# Patient Record
Sex: Male | Born: 1980 | Race: White | Hispanic: No | Marital: Married | Smoking: Never smoker
Health system: Southern US, Community
[De-identification: ages and names within clinical notes are randomized; demographics above are authoritative.]

## PROBLEM LIST (undated history)

## (undated) DIAGNOSIS — I10 Essential (primary) hypertension: Secondary | ICD-10-CM

## (undated) DIAGNOSIS — J45909 Unspecified asthma, uncomplicated: Secondary | ICD-10-CM

## (undated) DIAGNOSIS — M109 Gout, unspecified: Secondary | ICD-10-CM

## (undated) DIAGNOSIS — Z87442 Personal history of urinary calculi: Secondary | ICD-10-CM

## (undated) DIAGNOSIS — E785 Hyperlipidemia, unspecified: Secondary | ICD-10-CM

## (undated) DIAGNOSIS — R030 Elevated blood-pressure reading, without diagnosis of hypertension: Secondary | ICD-10-CM

## (undated) HISTORY — PX: WISDOM TOOTH EXTRACTION: SHX21

## (undated) HISTORY — PX: HERNIA REPAIR: SHX51

## (undated) HISTORY — DX: Morbid (severe) obesity due to excess calories: E66.01

## (undated) HISTORY — DX: Elevated blood-pressure reading, without diagnosis of hypertension: R03.0

## (undated) HISTORY — DX: Essential (primary) hypertension: I10

## (undated) HISTORY — DX: Hyperlipidemia, unspecified: E78.5

---

## 2009-02-27 ENCOUNTER — Emergency Department (HOSPITAL_COMMUNITY): Admission: EM | Admit: 2009-02-27 | Discharge: 2009-02-28 | Payer: Self-pay | Admitting: Emergency Medicine

## 2009-06-13 ENCOUNTER — Emergency Department (HOSPITAL_COMMUNITY): Admission: EM | Admit: 2009-06-13 | Discharge: 2009-06-13 | Payer: Self-pay | Admitting: Emergency Medicine

## 2010-04-20 LAB — RAPID STREP SCREEN (MED CTR MEBANE ONLY): Streptococcus, Group A Screen (Direct): NEGATIVE

## 2010-04-25 ENCOUNTER — Emergency Department (HOSPITAL_COMMUNITY)
Admission: EM | Admit: 2010-04-25 | Discharge: 2010-04-25 | Disposition: A | Discharge status: Home / Self Care | Payer: Commercial Indemnity | Attending: Emergency Medicine | Admitting: Emergency Medicine

## 2010-04-25 DIAGNOSIS — Y92009 Unspecified place in unspecified non-institutional (private) residence as the place of occurrence of the external cause: Secondary | ICD-10-CM | POA: Insufficient documentation

## 2010-04-25 DIAGNOSIS — T1500XA Foreign body in cornea, unspecified eye, initial encounter: Secondary | ICD-10-CM | POA: Insufficient documentation

## 2010-04-25 DIAGNOSIS — IMO0002 Reserved for concepts with insufficient information to code with codable children: Secondary | ICD-10-CM | POA: Insufficient documentation

## 2012-06-20 ENCOUNTER — Encounter: Payer: Self-pay | Admitting: Gastroenterology

## 2012-06-20 ENCOUNTER — Ambulatory Visit (INDEPENDENT_AMBULATORY_CARE_PROVIDER_SITE_OTHER): Payer: Commercial Indemnity | Admitting: Gastroenterology

## 2012-06-20 VITALS — BP 139/78 | HR 78 | Temp 98.1°F | Ht 72.0 in | Wt 306.8 lb

## 2012-06-20 DIAGNOSIS — K760 Fatty (change of) liver, not elsewhere classified: Secondary | ICD-10-CM

## 2012-06-20 DIAGNOSIS — K7689 Other specified diseases of liver: Secondary | ICD-10-CM

## 2012-06-20 NOTE — Patient Instructions (Addendum)
Continue to eat healthy as you are doing.   We will see you back in 3 months. At that time, we will recheck your liver numbers.  Please call if you have any further pain in the meantime.

## 2012-06-21 ENCOUNTER — Encounter: Payer: Self-pay | Admitting: Gastroenterology

## 2012-06-21 DIAGNOSIS — K760 Fatty (change of) liver, not elsewhere classified: Secondary | ICD-10-CM | POA: Insufficient documentation

## 2012-06-21 NOTE — Assessment & Plan Note (Signed)
32 year old male with mild elevated transaminases in the setting of a fatty liver, now reporting normalization of LFTs recently after significant change in diet, abstinence of ETOH, and weight loss of 28 lbs intentionally. [place] of abdomen with fatty liver, hepatic cyst, gallbladder polyps, gallstones. He reports vague mid-abdominal/RUQ discomfort about once a month, with the most severe episode after eggs. No concerning signs noted otherwise. Likely elevated AST/ALT secondary to fatty liver No need for further imaging of abdomen as cysts is likely benign. Need to obtain most recent blood work, return in 3 months. Consider HIDA vs EGD if further abdominal discomfort; however, I feel this may be dietary related. Continue weight loss. Recheck LFTs in 3 months.

## 2012-06-21 NOTE — Assessment & Plan Note (Signed)
Continue low-fat diet, avoidance of ETOH. LFTs in 3 months.

## 2012-06-21 NOTE — Progress Notes (Signed)
Referring Provider: Cheron Every, FNP Primary Care Physician:  Erasmo Downer, NP Primary Gastroenterologist:  Dr. Darrick Penna   Chief Complaint  Patient presents with  . abnormal [place]    HPI:   Mr. Jesse Terry is a pleasant 32 year old male, presenting at the request of Cheron Every, FNP, secondary to elevated LFTs ad abnormal ultrasound. Appears AST 51 and ALT 84 in April 2014. [place] of abdomen showed mild fatty liver, hepatic cyst, +gallstones without cholecystitis, gallbladder polyps, bilateral kidney stones without obstruction, mild splenomegaly.  He tells me his most recent LFTs were normal. After first LFTs drawn in April he changed eating habits, lost about 28 lbs purposefully. Rare discomfort at umbilicus noted X 6 months, usually once per month, radiation to RUQ. No vomiting or GERD.Denies constipation, diarrhea, rectal bleeding. Abdominal discomfort was most noticeable after eating eggs.    Past Medical History  Diagnosis Date  . Borderline hypertension   . Hyperlipidemia     Past Surgical History  Procedure Laterality Date  . Hernia repair    . Wisdom tooth extraction      No current outpatient prescriptions on file.   No current facility-administered medications for this visit.    Allergies as of 06/20/2012  . (No Known Allergies)    Family History  Problem Relation Age of Onset  . Colon cancer Neg Hx     History   Social History  . Marital Status: Married    Spouse Name: N/A    Number of Children: N/A  . Years of Education: N/A   Occupational History  . Not on file.   Social History Main Topics  . Smoking status: Never Smoker   . Smokeless tobacco: Not on file  . Alcohol Use: Yes     Comment: former use last drink was one month ago: 1-2 drinks on the weekend  . Drug Use: No  . Sexually Active: Not on file   Other Topics Concern  . Not on file   Social History Narrative  . No narrative on file    Review of Systems: Negative unless mentioned in  HPI  Physical Exam: BP 139/78  Pulse 78  Temp(Src) 98.1 F (36.7 C) (Oral)  Ht 6' (1.829 m)  Wt 306 lb 12.8 oz (139.164 kg)  BMI 41.6 kg/m2 General:   Alert and oriented. Well-developed, well-nourished, pleasant and cooperative. Head:  Normocephalic and atraumatic. Eyes:  Conjunctiva pink, sclera clear, no icterus.   Conjunctiva pink. Ears:  Normal auditory acuity. Nose:  No deformity, discharge,  or lesions. Mouth:  No deformity or lesions, mucosa pink and moist.  Neck:  Supple, without mass or thyromegaly. Lungs:  Clear to auscultation bilaterally, without wheezing, rales, or rhonchi.  Heart:  S1, S2 present without murmurs noted.  Abdomen:  +BS, soft, obese, non-tender and non-distended. Without mass or HSM. No rebound or guarding. No hernias noted. Rectal:  Deferred  Msk:  Symmetrical without gross deformities. Normal posture. Extremities:  Without clubbing or edema. Neurologic:  Alert and  oriented x4;  grossly normal neurologically. Skin:  Intact, warm and dry without significant lesions or rashes Cervical Nodes:  No significant cervical adenopathy. Psych:  Alert and cooperative. Normal mood and affect.

## 2012-06-22 NOTE — Progress Notes (Signed)
Cc PCP 

## 2012-06-27 ENCOUNTER — Other Ambulatory Visit: Payer: Self-pay | Admitting: Gastroenterology

## 2012-06-27 DIAGNOSIS — K760 Fatty (change of) liver, not elsewhere classified: Secondary | ICD-10-CM

## 2012-07-11 NOTE — Progress Notes (Signed)
Repeat LFTs in May 2014 completely normalized. Will abstract.

## 2012-07-17 LAB — HEPATIC FUNCTION PANEL
AST: 21 U/L
Albumin: 4.1
Alkaline Phosphatase: 74 U/L
Bilirubin, Indirect: 0.5
Total Bilirubin: 0.7 mg/dL

## 2012-07-17 LAB — COMPREHENSIVE METABOLIC PANEL
Alkaline Phosphatase: 78 U/L
Total Bilirubin: 0.9 mg/dL

## 2012-07-18 ENCOUNTER — Telehealth: Payer: Self-pay | Admitting: Gastroenterology

## 2012-07-18 NOTE — Progress Notes (Signed)
In reviewing patient's information, will need to proceed with surveillance [place] of abdomen due to gallbladder polyps at next visit.  Will discuss surgical referral for elective cholecystectomy at that time.

## 2012-07-18 NOTE — Telephone Encounter (Signed)
I called pt and he said that he spoke to Jesse Terry about doing a Nurse, learning disability when he had the office visit. He said she was interested in it and asked him to let her know how it went if he did it. He was calling today to see if she could give him a note for a couple of days off work so he could do the cleansing. He is not having any problems today. However he did have some right abdominal pain on mon that lasted several hours. He did not have any vomiting and maybe slight nausea. He said he is not having any pain or problems today, he just thought he would like to do it. He's afraid that he will have more episodes of the abdominal pain. I explained to him that Jesse Terry is away until Tues, and I did not think that she would take him out of work to do something that she did not recommend. I told him I will let PA know and see if she has any recommendations and we could certainly let Jesse Terry know when she returns on Tues. I told him if he starts having any abdominal pain, nausea vomiting to please let us know.

## 2012-07-18 NOTE — Telephone Encounter (Signed)
Pt is aware that this will be addressed with Tobi Bastos when she returns.

## 2012-07-18 NOTE — Telephone Encounter (Signed)
Patient is asking for Jesse Terry to take him out of work for two days to do the Gallbladder Cleansing that was discussed at the office visit. 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 I explained to him it will be next week before Jesse Terry can answer because she is out of the office, you can reach him at [phone]

## 2012-07-18 NOTE — Telephone Encounter (Signed)
Defer to [facility] regarding "gallbladder cleansing".

## 2012-07-25 NOTE — Telephone Encounter (Signed)
Called and informed pt. He is doing better at this time and will call if he has problems.

## 2012-07-25 NOTE — Telephone Encounter (Signed)
In reviewing patient's information, will need to proceed with surveillance [place] of abdomen due to gallbladder polyps at next visit.  Will discuss surgical referral for elective cholecystectomy at that time.   We do not recommend gallbladder cleansing; won't be able to take him out of work for this.

## 2012-08-14 ENCOUNTER — Other Ambulatory Visit: Payer: Self-pay

## 2012-08-14 DIAGNOSIS — K760 Fatty (change of) liver, not elsewhere classified: Secondary | ICD-10-CM

## 2012-09-05 ENCOUNTER — Ambulatory Visit: Payer: Commercial Indemnity | Admitting: Gastroenterology

## 2012-09-12 ENCOUNTER — Ambulatory Visit: Payer: Commercial Indemnity | Admitting: Gastroenterology

## 2012-09-19 ENCOUNTER — Ambulatory Visit: Payer: Commercial Indemnity | Admitting: Gastroenterology

## 2012-09-19 ENCOUNTER — Telehealth: Payer: Self-pay | Admitting: *Deleted

## 2012-09-19 NOTE — Telephone Encounter (Signed)
Please send letter for f/u.  

## 2012-09-19 NOTE — Telephone Encounter (Signed)
Pt is a no show

## 2012-09-20 ENCOUNTER — Encounter: Payer: Self-pay | Admitting: *Deleted

## 2012-09-20 NOTE — Telephone Encounter (Signed)
LETTER SENT TO PT.

## 2012-11-05 NOTE — Progress Notes (Signed)
Please route this note to PCP again, as I added an update around June (I didn't send it to you then, my fault). They had sent a referral to [place] due to gallbladder polyps. We have recommend surveillance [place] due to gallbladder polyps, with consideration for lap chole in the future. Patient no-showed his last visit.   Please CC PCP. Thanks!

## 2012-11-05 NOTE — Telephone Encounter (Signed)
I just want to make sure that patient knows that he will need routine ultrasounds of his abdomen due to the gallbladder polyps. I did not review this with him at the time of the visit, and it is important that he knows that this needs to be followed up on. The reason for this is because of the risk for gallbladder cancer due to the polyps; I recommend elective referral to general surgery. He no-showed his last visit; we need to get him back in to recheck LFTs and discuss the plan for gallbladder surveillance vs elective removal.

## 2012-11-05 NOTE — Progress Notes (Signed)
CC'd to PCP 

## 2012-11-07 NOTE — Telephone Encounter (Signed)
Tried to call pt. He was not at home, but a child gave me a phone number to try to reach him at, [phone]. I tried to call and got VM. LMOM for a return call for some important information. Mailing  Letter to call also.

## 2012-11-21 ENCOUNTER — Encounter (HOSPITAL_BASED_OUTPATIENT_CLINIC_OR_DEPARTMENT_OTHER): Payer: Self-pay | Admitting: *Deleted

## 2012-11-26 ENCOUNTER — Other Ambulatory Visit: Payer: Self-pay | Admitting: Orthopedic Surgery

## 2012-11-28 NOTE — H&P (Signed)
  [facility] [address]   [address] [place], [place] [address] [phone] A Division of [facility]  Loreta Ave, M.D.   Robert A. Thurston Hole, M.D.   Burnell Blanks, M.D.   Eulas Post, M.D.   Lunette Stands, M.D Jewel Baize. Eulah Pont, M.D.  Buford Dresser, M.D.  Charlsie Quest, M.D.  Estell Harpin, M.D.   Melina Fiddler, M.D. Danford Bad. Willa Rough, PA-C  Kirstin A. Shepperson, PA-C  Josh Tunica, PA-C [place], [place]   RE: Jesse Terry, Jesse Terry                                [id number]      DOB: Feb 16, 1980 PROGRESS NOTE: 11-16-12 Jesse Terry is seen in consultation today at the request of Dr. Farris Has.  Longstanding worsening symptoms; pain, catching and losing motion in the right ankle.  All of this tends to be medial and anteromedial.  Getting worse rather than better.  Having more and more episodes of this acute sharp pain with less and less provocation.  I have reviewed workup and treatment to date.  X-rays showed some mild degenerative changes medially, but not too severe.  Persistent symptoms eventually leading to workup with MRI.  This shows a picture of medial bony impingement in the ankle.  Bony impingement between the talus and medial malleolus with kissing bony contusions.  Other structures look fairly good.  Articular cartilage looks good.  Ligaments intact.  Presents to discuss definitive treatment.  Based on workup and studies really the only true option we have is a decompression to obliterate the bony impingement and symptoms from that.  I met with [name] and his wife.  I went through his workup.  I have looked at his x-rays, his MRI and his MRI report.    EXAMINATION: General exam is outlined and included in the chart.  Height: 6?0.  Weight: 310 pounds.  Right ankle has limited dorsiflexion of about 5 degrees with acute pain medially and bone on bone impingement.  Fairly good plantarflexion.  No neurovascular  compromise.  A little swelling across the ankle.  No instability.  Neurovascularly intact distally.  The opposite left ankle has very good motion without any impingement or instability.    DISPOSITION:  Anterior impingement, right ankle, posttraumatic.  Getting worse rather than better.  The only viable option we have here is arthroscopic decompression.  He and his wife both feel his symptoms are bad enough to warrant this.  We discussed exam under anesthesia, arthroscopy, debridement and decompression.  Procedure, risks, benefits and complications reviewed in detail.  He works as a Naval architect for [employer].  I have told him that he would probably be out of work for six weeks.  Obviously the more degenerative change we find in his ankle the more his prognosis is worse in the future.  More than 25 minutes spent face-to-face covering all of this with him.     Loreta Ave, M.D.   Electronically verified by Loreta Ave, M.D. DFM:jjh D 11-16-12

## 2012-11-29 ENCOUNTER — Ambulatory Visit (HOSPITAL_BASED_OUTPATIENT_CLINIC_OR_DEPARTMENT_OTHER)
Admission: RE | Admit: 2012-11-29 | Discharge: 2012-11-29 | Disposition: A | Discharge status: Home / Self Care | Payer: Managed Care, Other (non HMO) | Source: Ambulatory Visit | Attending: Orthopedic Surgery | Admitting: Orthopedic Surgery

## 2012-11-29 ENCOUNTER — Ambulatory Visit (HOSPITAL_BASED_OUTPATIENT_CLINIC_OR_DEPARTMENT_OTHER): Payer: Managed Care, Other (non HMO) | Admitting: Anesthesiology

## 2012-11-29 ENCOUNTER — Encounter (HOSPITAL_BASED_OUTPATIENT_CLINIC_OR_DEPARTMENT_OTHER)
Admission: RE | Disposition: A | Discharge status: Home / Self Care | Payer: Self-pay | Source: Ambulatory Visit | Attending: Orthopedic Surgery

## 2012-11-29 ENCOUNTER — Encounter (HOSPITAL_BASED_OUTPATIENT_CLINIC_OR_DEPARTMENT_OTHER): Payer: Managed Care, Other (non HMO) | Admitting: Anesthesiology

## 2012-11-29 ENCOUNTER — Encounter (HOSPITAL_BASED_OUTPATIENT_CLINIC_OR_DEPARTMENT_OTHER): Payer: Self-pay | Admitting: Anesthesiology

## 2012-11-29 DIAGNOSIS — I10 Essential (primary) hypertension: Secondary | ICD-10-CM | POA: Insufficient documentation

## 2012-11-29 DIAGNOSIS — M25571 Pain in right ankle and joints of right foot: Secondary | ICD-10-CM

## 2012-11-29 DIAGNOSIS — M12579 Traumatic arthropathy, unspecified ankle and foot: Secondary | ICD-10-CM | POA: Insufficient documentation

## 2012-11-29 DIAGNOSIS — M6580 Other synovitis and tenosynovitis, unspecified site: Secondary | ICD-10-CM | POA: Insufficient documentation

## 2012-11-29 DIAGNOSIS — M898X9 Other specified disorders of bone, unspecified site: Secondary | ICD-10-CM | POA: Insufficient documentation

## 2012-11-29 HISTORY — DX: Unspecified asthma, uncomplicated: J45.909

## 2012-11-29 HISTORY — PX: ANKLE ARTHROSCOPY: SHX545

## 2012-11-29 SURGERY — ARTHROSCOPY, ANKLE
Anesthesia: Regional | Site: Ankle | Laterality: Right | Wound class: Clean

## 2012-11-29 MED ORDER — BUPIVACAINE HCL (PF) 0.5 % IJ SOLN
INTRAMUSCULAR | Status: DC | PRN
  Administered 2012-11-29: 10 mL

## 2012-11-29 MED ORDER — MIDAZOLAM HCL 2 MG/2ML IJ SOLN
1.0000 mg | INTRAMUSCULAR | Status: DC | PRN
  Administered 2012-11-29: 2 mg via INTRAVENOUS

## 2012-11-29 MED ORDER — LIDOCAINE HCL (CARDIAC) 20 MG/ML IV SOLN
INTRAVENOUS | Status: DC | PRN
  Administered 2012-11-29: 50 mg via INTRAVENOUS

## 2012-11-29 MED ORDER — OXYCODONE HCL 5 MG PO TABS: 5.0000 mg | ORAL_TABLET | Freq: Once | ORAL | Status: DC | PRN

## 2012-11-29 MED ORDER — ONDANSETRON HCL 4 MG/2ML IJ SOLN
INTRAMUSCULAR | Status: DC | PRN
  Administered 2012-11-29: 4 mg via INTRAVENOUS

## 2012-11-29 MED ORDER — BUPIVACAINE HCL (PF) 0.25 % IJ SOLN
INTRAMUSCULAR | Status: DC | PRN
  Administered 2012-11-29: 5 mL

## 2012-11-29 MED ORDER — CEFAZOLIN SODIUM 1-5 GM-% IV SOLN
INTRAVENOUS | Status: AC
  Filled 2012-11-29: qty 50

## 2012-11-29 MED ORDER — BUPIVACAINE HCL (PF) 0.25 % IJ SOLN
INTRAMUSCULAR | Status: AC
  Filled 2012-11-29: qty 30

## 2012-11-29 MED ORDER — PROPOFOL 10 MG/ML IV BOLUS
INTRAVENOUS | Status: DC | PRN
  Administered 2012-11-29: 250 mg via INTRAVENOUS

## 2012-11-29 MED ORDER — CEFAZOLIN SODIUM-DEXTROSE 2-3 GM-% IV SOLR
INTRAVENOUS | Status: AC
  Filled 2012-11-29: qty 50

## 2012-11-29 MED ORDER — METHYLPREDNISOLONE ACETATE 80 MG/ML IJ SUSP
INTRAMUSCULAR | Status: AC
  Filled 2012-11-29: qty 1

## 2012-11-29 MED ORDER — OXYCODONE HCL 5 MG/5ML PO SOLN: 5.0000 mg | Freq: Once | ORAL | Status: DC | PRN

## 2012-11-29 MED ORDER — HYDROMORPHONE HCL PF 1 MG/ML IJ SOLN
INTRAMUSCULAR | Status: AC
  Filled 2012-11-29: qty 1

## 2012-11-29 MED ORDER — FENTANYL CITRATE 0.05 MG/ML IJ SOLN
INTRAMUSCULAR | Status: AC
  Filled 2012-11-29: qty 2

## 2012-11-29 MED ORDER — DEXTROSE 5 % IV SOLN
3.0000 g | INTRAVENOUS | Status: AC
  Administered 2012-11-29: 3 g via INTRAVENOUS

## 2012-11-29 MED ORDER — MIDAZOLAM HCL 2 MG/2ML IJ SOLN
INTRAMUSCULAR | Status: AC
  Filled 2012-11-29: qty 2

## 2012-11-29 MED ORDER — CHLORHEXIDINE GLUCONATE 4 % EX LIQD: 60.0000 mL | Freq: Once | CUTANEOUS | Status: DC

## 2012-11-29 MED ORDER — BUPIVACAINE-EPINEPHRINE PF 0.5-1:200000 % IJ SOLN
INTRAMUSCULAR | Status: DC | PRN
  Administered 2012-11-29: 30 mL

## 2012-11-29 MED ORDER — FENTANYL CITRATE 0.05 MG/ML IJ SOLN
50.0000 ug | INTRAMUSCULAR | Status: DC | PRN
  Administered 2012-11-29: 100 ug via INTRAVENOUS

## 2012-11-29 MED ORDER — DEXAMETHASONE SODIUM PHOSPHATE 4 MG/ML IJ SOLN
INTRAMUSCULAR | Status: DC | PRN
  Administered 2012-11-29: 10 mg via INTRAVENOUS

## 2012-11-29 MED ORDER — HYDROMORPHONE HCL PF 1 MG/ML IJ SOLN
0.2500 mg | INTRAMUSCULAR | Status: DC | PRN
  Administered 2012-11-29 (×3): 0.5 mg via INTRAVENOUS

## 2012-11-29 MED ORDER — FENTANYL CITRATE 0.05 MG/ML IJ SOLN
INTRAMUSCULAR | Status: DC | PRN
  Administered 2012-11-29 (×2): 25 ug via INTRAVENOUS
  Administered 2012-11-29: 50 ug via INTRAVENOUS

## 2012-11-29 MED ORDER — ACETAMINOPHEN 500 MG PO TABS
1000.0000 mg | ORAL_TABLET | Freq: Once | ORAL | Status: AC
  Administered 2012-11-29: 1000 mg via ORAL

## 2012-11-29 MED ORDER — LACTATED RINGERS IV SOLN
INTRAVENOUS | Status: DC
  Administered 2012-11-29 (×3): via INTRAVENOUS

## 2012-11-29 MED ORDER — METHYLPREDNISOLONE ACETATE 80 MG/ML IJ SUSP
INTRAMUSCULAR | Status: DC | PRN
  Administered 2012-11-29: 80 mg via INTRA_ARTICULAR

## 2012-11-29 MED ORDER — FENTANYL CITRATE 0.05 MG/ML IJ SOLN
INTRAMUSCULAR | Status: AC
  Filled 2012-11-29: qty 4

## 2012-11-29 MED ORDER — OXYCODONE-ACETAMINOPHEN 7.5-325 MG PO TABS: 1.0000 | ORAL_TABLET | ORAL | Status: DC | PRN

## 2012-11-29 MED ORDER — DEXTROSE-NACL 5-0.45 % IV SOLN: INTRAVENOUS | Status: DC

## 2012-11-29 SURGICAL SUPPLY — 47 items
BANDAGE ELASTIC 4 VELCRO ST LF (GAUZE/BANDAGES/DRESSINGS) ×2 IMPLANT
BLADE 4.2CUDA (BLADE) IMPLANT
BLADE CUDA GRT WHITE 3.5 (BLADE) IMPLANT
BLADE CUDA SHAVER 3.5 (BLADE) IMPLANT
BLADE CUTTER GATOR 3.5 (BLADE) IMPLANT
BLADE GREAT WHITE 4.2 (BLADE) IMPLANT
BNDG ESMARK 4X9 LF (GAUZE/BANDAGES/DRESSINGS) ×2 IMPLANT
BUR CUDA 2.9 (BURR) IMPLANT
BUR FULL RADIUS 2.9 (BURR) IMPLANT
BUR OVAL 4.0 (BURR) IMPLANT
BUR OVAL 6.0 (BURR) IMPLANT
CANISTER OMNI JUG 16 LITER (MISCELLANEOUS) IMPLANT
CANISTER SUCT 3000ML (MISCELLANEOUS) IMPLANT
CUFF TOURNIQUET SINGLE 34IN LL (TOURNIQUET CUFF) IMPLANT
CUTTER MENISCUS  4.2MM (BLADE)
CUTTER MENISCUS 4.2MM (BLADE) IMPLANT
DECANTER SPIKE VIAL GLASS SM (MISCELLANEOUS) IMPLANT
DRAPE ARTHROSCOPY W/POUCH 90 (DRAPES) ×2 IMPLANT
DRAPE OEC MINIVIEW 54X84 (DRAPES) IMPLANT
DRAPE SURG 17X23 STRL (DRAPES) ×2 IMPLANT
DURAPREP 26ML APPLICATOR (WOUND CARE) ×2 IMPLANT
ELECT MENISCUS 165MM 90D (ELECTRODE) IMPLANT
ELECT REM PT RETURN 9FT ADLT (ELECTROSURGICAL) ×2
ELECTRODE REM PT RTRN 9FT ADLT (ELECTROSURGICAL) ×1 IMPLANT
GAUZE XEROFORM 1X8 LF (GAUZE/BANDAGES/DRESSINGS) ×2 IMPLANT
GLOVE BIO SURGEON STRL SZ8 (GLOVE) ×2 IMPLANT
GLOVE BIOGEL PI IND STRL 8.5 (GLOVE) ×1 IMPLANT
GLOVE BIOGEL PI INDICATOR 8.5 (GLOVE) ×1
GLOVE ORTHO TXT STRL SZ7.5 (GLOVE) ×4 IMPLANT
GOWN BRE IMP PREV XXLGXLNG (GOWN DISPOSABLE) ×2 IMPLANT
GOWN PREVENTION PLUS XLARGE (GOWN DISPOSABLE) ×2 IMPLANT
IV NS IRRIG 3000ML ARTHROMATIC (IV SOLUTION) IMPLANT
NEEDLE KEITH (NEEDLE) IMPLANT
PACK ARTHROSCOPY DSU (CUSTOM PROCEDURE TRAY) ×2 IMPLANT
PACK BASIN DAY SURGERY FS (CUSTOM PROCEDURE TRAY) ×2 IMPLANT
PENCIL BUTTON HOLSTER BLD 10FT (ELECTRODE) IMPLANT
SET ARTHROSCOPY TUBING (MISCELLANEOUS) ×1
SET ARTHROSCOPY TUBING LN (MISCELLANEOUS) ×1 IMPLANT
SLEEVE SCD COMPRESS KNEE MED (MISCELLANEOUS) IMPLANT
SPONGE GAUZE 4X4 12PLY (GAUZE/BANDAGES/DRESSINGS) ×4 IMPLANT
STOCKINETTE 4X48 STRL (DRAPES) ×2 IMPLANT
STRAP ANKLE FOOT DISTRACTOR (ORTHOPEDIC SUPPLIES) IMPLANT
SUT ETHILON 3 0 PS 1 (SUTURE) ×2 IMPLANT
SUT VIC AB 3-0 SH 27 (SUTURE)
SUT VIC AB 3-0 SH 27X BRD (SUTURE) IMPLANT
TUBE CONNECTING 20X1/4 (TUBING) IMPLANT
WATER STERILE IRR 1000ML POUR (IV SOLUTION) ×2 IMPLANT

## 2012-11-29 NOTE — Transfer of Care (Signed)
Immediate Anesthesia Transfer of Care Note  Patient: Jesse Terry  Procedure(s) Performed: Procedure(s): RIGHT ANKLE ARTHROSCOPY WITH EXTENSIVE DEBRIDEMENT (Right)  Patient Location: PACU  Anesthesia Type:General  Level of Consciousness: awake and alert   Airway & Oxygen Therapy: Patient Spontanous Breathing and Patient connected to face mask oxygen  Post-op Assessment: Report given to PACU RN and Post -op Vital signs reviewed and stable  Post vital signs: Reviewed and stable  Complications: No apparent anesthesia complications

## 2012-11-29 NOTE — Interval H&P Note (Signed)
History and Physical Interval Note:  11/29/2012 7:35 AM  Jesse Terry  has presented today for surgery, with the diagnosis of RIGHT ANKLE: ARTICULAR CARTILAGE DISORDER, ANKLE AND FOOT  The various methods of treatment have been discussed with the patient and family. After consideration of risks, benefits and other options for treatment, the patient has consented to  Procedure(s): RIGHT ANKLE ARTHROSCOPY WITH EXTENSIVE DEBRIDEMENT (Right) as a surgical intervention .  The patient's history has been reviewed, patient examined, no change in status, stable for surgery.  I have reviewed the patient's chart and labs.  Questions were answered to the patient's satisfaction.     MURPHY,DANIEL F

## 2012-11-29 NOTE — Anesthesia Postprocedure Evaluation (Signed)
  Anesthesia Post-op Note  Patient: Jesse Terry  Procedure(s) Performed: Procedure(s): RIGHT ANKLE ARTHROSCOPY WITH EXTENSIVE DEBRIDEMENT (Right)  Patient Location: PACU  Anesthesia Type:General and block  Level of Consciousness: awake and alert   Airway and Oxygen Therapy: Patient Spontanous Breathing  Post-op Pain: mild  Post-op Assessment: Post-op Vital signs reviewed, Patient's Cardiovascular Status Stable and Respiratory Function Stable  Post-op Vital Signs: Reviewed  Filed Vitals:   11/29/12 1245  BP: 124/78  Pulse: 92  Temp:   Resp: 17    Complications: No apparent anesthesia complications

## 2012-11-29 NOTE — Anesthesia Preprocedure Evaluation (Signed)
Anesthesia Evaluation  Patient identified by MRN, date of birth, ID band Patient awake    Reviewed: Allergy & Precautions, H&P , NPO status , Patient's Chart, lab work & pertinent test results  Airway Mallampati: I TM Distance: >3 FB Neck ROM: Full    Dental no notable dental hx. (+) Teeth Intact and Dental Advisory Given   Pulmonary neg pulmonary ROS,  breath sounds clear to auscultation  Pulmonary exam normal       Cardiovascular hypertension, Rhythm:Regular Rate:Normal     Neuro/Psych negative neurological ROS  negative psych ROS   GI/Hepatic negative GI ROS, Neg liver ROS,   Endo/Other  Morbid obesity  Renal/GU negative Renal ROS  negative genitourinary   Musculoskeletal   Abdominal   Peds  Hematology negative hematology ROS (+)   Anesthesia Other Findings   Reproductive/Obstetrics negative OB ROS                           Anesthesia Physical Anesthesia Plan  ASA: III  Anesthesia Plan: General and Regional   Post-op Pain Management:    Induction: Intravenous  Airway Management Planned: LMA  Additional Equipment:   Intra-op Plan:   Post-operative Plan: Extubation in OR  Informed Consent: I have reviewed the patients History and Physical, chart, labs and discussed the procedure including the risks, benefits and alternatives for the proposed anesthesia with the patient or authorized representative who has indicated his/her understanding and acceptance.   Dental advisory given  Plan Discussed with: CRNA  Anesthesia Plan Comments:         Anesthesia Quick Evaluation

## 2012-11-29 NOTE — Progress Notes (Signed)
Assisted Dr. Fitzgerald with right, ultrasound guided, popliteal/saphenous block. Side rails up, monitors on throughout procedure. See vital signs in flow sheet. Tolerated Procedure well. 

## 2012-11-29 NOTE — Anesthesia Procedure Notes (Addendum)
Anesthesia Regional Block:  Popliteal block  Pre-Anesthetic Checklist: ,, timeout performed, Correct Patient, Correct Site, Correct Laterality, Correct Procedure, Correct Position, site marked, Risks and benefits discussed, pre-op evaluation, post-op pain management  Laterality: Right  Prep: Maximum Sterile Barrier Precautions used and chloraprep       Needles:  Injection technique: Single-shot  Needle Type: Echogenic Stimulator Needle      Needle Gauge: 21 and 21 G    Additional Needles:  Procedures: ultrasound guided (picture in chart) and nerve stimulator Popliteal block  Nerve Stimulator or Paresthesia:  Response: Peroneal,  Response: Tibial,   Additional Responses:   Narrative:  Start time: 11/29/2012 9:28 AM End time: 11/29/2012 9:42 AM Injection made incrementally with aspirations every 5 mL. Anesthesiologist: Sampson Goon, MD  Additional Notes: 2% Lidocaine skin wheel. Saphenous block with 10cc of 0.5% Bupivicaine plain.  Popliteal block Procedure Name: LMA Insertion Date/Time: 11/29/2012 9:58 AM Performed by: Caren Macadam Pre-anesthesia Checklist: Patient identified, Emergency Drugs available, Suction available and Patient being monitored Patient Re-evaluated:Patient Re-evaluated prior to inductionOxygen Delivery Method: Circle System Utilized Preoxygenation: Pre-oxygenation with 100% oxygen Intubation Type: IV induction Ventilation: Mask ventilation without difficulty LMA: LMA inserted LMA Size: 5.0 Number of attempts: 1 Airway Equipment and Method: bite block Placement Confirmation: positive ETCO2 and breath sounds checked- equal and bilateral Tube secured with: Tape Dental Injury: Teeth and Oropharynx as per pre-operative assessment

## 2012-11-30 ENCOUNTER — Encounter (HOSPITAL_BASED_OUTPATIENT_CLINIC_OR_DEPARTMENT_OTHER): Payer: Self-pay | Admitting: Orthopedic Surgery

## 2012-11-30 NOTE — Op Note (Signed)
NAMEANSEN, SAYEGH                ACCOUNT NO.:  [id number]  MEDICAL RECORD NO.:  [id number]  LOCATION:                               FACILITY:  [facility]  PHYSICIAN:  Loreta Ave, M.D. 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  Reactive synovitis anteriorly. Minimal diffuse degenerative changes, otherwise.  No instability.  PROCEDURE:  Right ankle exam under anesthesia, arthroscopy.  Partial synovectomy.  Extensive debridement with removal of impinging talotibial spurs throughout the entire ankle especially on the medial side. Fluoroscopic confirmation of adequate decompression.  SURGEON:  Loreta Ave, M.D.  ASSISTANT:  Domingo Cocking, PA, present throughout the entire case and necessary for timely completion of the procedure.  ANESTHESIA:  General.  BLOOD LOSS:  Minimal.  SPECIMENS:  None.  CULTURES:  None.  COMPLICATIONS:  None.  DRESSINGS:  Soft compressive.  TOURNIQUET TIME:  45 minutes.  PROCEDURE IN DETAIL:  The patient was brought to the operating room, placed on the operating table in supine position.  After adequate anesthesia been obtained, tourniquet applied, arthroscopy applied, prepped and draped in usual sterile fashion.  Exsanguinated with elevation of an Esmarch.  Tourniquet inflated to 350 mmHg.  Two portals, one each medial and lateral parapatellar.  Arthroscope reduced, ankle distended and inspected.  A lot of inflammatory debris, synovitis throughout the front of the  ankle debrided.  Confirmed marked impingement tibiotalar spurs especially on the medial side greater than lateral.  Sequential removal of all spurs with a high-speed bur obliterating all anterior impingement.  I then had excellent access to the entire ankle.  All other articular surfaces were quite good with minimal degenerative change.  I thoroughly inspected the ankle to make sure there was no further impingement.  Fluoroscopic views also obtained, did show full motion without impingement.  Instruments and fluid were removed.  Ankle injected with Marcaine.  Portals injected with Marcaine, closed with nylon.  Sterile compressive dressing applied. Tourniquet deflated and removed.  Anesthesia reversed.  Brought to the recovery room.  Tolerated the surgery well.  No complications.     Loreta Ave, M.D.     DFM/MEDQ  D:  11/29/2012  T:  11/30/2012  Job:  [id number]

## 2012-12-29 NOTE — Progress Notes (Signed)
REVIEWED.  

## 2013-01-15 ENCOUNTER — Emergency Department (HOSPITAL_COMMUNITY): Payer: Managed Care, Other (non HMO)

## 2013-01-15 ENCOUNTER — Encounter (HOSPITAL_COMMUNITY): Payer: Self-pay | Admitting: Emergency Medicine

## 2013-01-15 DIAGNOSIS — R030 Elevated blood-pressure reading, without diagnosis of hypertension: Secondary | ICD-10-CM | POA: Insufficient documentation

## 2013-01-15 DIAGNOSIS — R002 Palpitations: Secondary | ICD-10-CM | POA: Insufficient documentation

## 2013-01-15 DIAGNOSIS — J45909 Unspecified asthma, uncomplicated: Secondary | ICD-10-CM | POA: Insufficient documentation

## 2013-01-15 DIAGNOSIS — E785 Hyperlipidemia, unspecified: Secondary | ICD-10-CM | POA: Insufficient documentation

## 2013-01-15 DIAGNOSIS — R000 Tachycardia, unspecified: Secondary | ICD-10-CM | POA: Insufficient documentation

## 2013-01-15 LAB — CBC
HCT: 38.3 % — ABNORMAL LOW (ref 39.0–52.0)
Hemoglobin: 13.1 g/dL (ref 13.0–17.0)
MCH: 27.9 pg (ref 26.0–34.0)
MCHC: 34.2 g/dL (ref 30.0–36.0)

## 2013-01-15 LAB — POCT I-STAT TROPONIN I: Troponin i, poc: 0 ng/mL (ref 0.00–0.08)

## 2013-01-15 LAB — BASIC METABOLIC PANEL
BUN: 14 mg/dL (ref 6–23)
Calcium: 9.5 mg/dL (ref 8.4–10.5)
GFR calc non Af Amer: >90 mL/min (ref >90)
Glucose, Bld: 125 mg/dL — ABNORMAL HIGH (ref 70–99)

## 2013-01-15 NOTE — ED Notes (Signed)
Pt states he has been having a "fluttering" feeling in his chest since 0900.  Pt states he has felt similar in the past, but not for this long.

## 2013-01-16 ENCOUNTER — Emergency Department (HOSPITAL_COMMUNITY)
Admission: EM | Admit: 2013-01-16 | Discharge: 2013-01-16 | Disposition: A | Discharge status: Home / Self Care | Payer: Managed Care, Other (non HMO) | Attending: Emergency Medicine | Admitting: Emergency Medicine

## 2013-01-16 DIAGNOSIS — R002 Palpitations: Secondary | ICD-10-CM

## 2013-01-16 NOTE — ED Notes (Signed)
Pt A&Ox4, ambulatory upon discharge, verbalizing no complaints at this time.

## 2013-01-16 NOTE — ED Provider Notes (Signed)
CSN: [id number]     Arrival date & time 01/15/13  2237 History   First MD Initiated Contact with Patient 01/16/13 0210     Chief Complaint  Patient presents with  . Palpitations   (Consider location/radiation/quality/duration/timing/severity/associated sxs/prior Treatment) HPI Hx per PT - fluttering sensation in his chest since this am. Drinks tea during the day, no sodas, no coffee. No CP or SOB. No F/C or recent illness. Symptoms mild to MOD.  Lasted most the day, now resolved. H/o same but never lasted that long. No medications. No h/o thyroid disease.   Past Medical History  Diagnosis Date  . Borderline hypertension   . Hyperlipidemia   . Asthma     as child, no problems now   Past Surgical History  Procedure Laterality Date  . Hernia repair    . Wisdom tooth extraction    . Wisdom tooth extraction    . Ankle arthroscopy Right 11/29/2012    Procedure: RIGHT ANKLE ARTHROSCOPY WITH EXTENSIVE DEBRIDEMENT;  Surgeon: Loreta Ave, MD;  Location: [facility];  Service: Orthopedics;  Laterality: Right;   Family History  Problem Relation Age of Onset  . Colon cancer Neg Hx    History  Substance Use Topics  . Smoking status: Never Smoker   . Smokeless tobacco: Not on file  . Alcohol Use: Yes     Comment: former use last drink was one month ago: 1-2 drinks on the weekend    Review of Systems  Constitutional: Negative for fever and chills.  HENT: Negative for sore throat, trouble swallowing and voice change.   Eyes: Negative for pain.  Respiratory: Negative for cough, shortness of breath and wheezing.   Cardiovascular: Positive for palpitations. Negative for chest pain and leg swelling.  Gastrointestinal: Negative for abdominal pain.  Genitourinary: Negative for dysuria.  Musculoskeletal: Negative for back pain, neck pain and neck stiffness.  Skin: Negative for rash.  Neurological: Negative for headaches.  All other systems reviewed and are  negative.    Allergies  Review of patient's allergies indicates no known allergies.  Home Medications   Current Outpatient Rx  Name  Route  Sig  Dispense  Refill  . acetaminophen (TYLENOL) 500 MG tablet   Oral   Take 500-1,000 mg by mouth every 6 (six) hours as needed for mild pain, moderate pain, fever or headache.          BP 126/78  Pulse 81  Temp(Src) 98.2 F (36.8 C) (Oral)  Resp 9  Ht 6' (1.829 m)  Wt 320 lb 9.6 oz (145.423 kg)  BMI 43.47 kg/m2  SpO2 96% Physical Exam  Constitutional: He is oriented to person, place, and time. He appears well-developed and well-nourished.  HENT:  Head: Normocephalic and atraumatic.  Mouth/Throat: Oropharynx is clear and moist.  Eyes: EOM are normal. Pupils are equal, round, and reactive to light.  Neck: Neck supple. No tracheal deviation present. No thyromegaly present.  Cardiovascular: Regular rhythm and intact distal pulses.   Pulmonary/Chest: Effort normal. No respiratory distress. He has no wheezes. He has no rales. He exhibits no tenderness.  Abdominal: Soft. Bowel sounds are normal. He exhibits no distension. There is no tenderness.  Musculoskeletal: Normal range of motion. He exhibits no edema and no tenderness.  Neurological: He is alert and oriented to person, place, and time. No cranial nerve deficit. Coordination normal.  Skin: Skin is warm and dry.    ED Course  Procedures (including critical care time) Labs Review  Labs Reviewed  CBC - Abnormal; Notable for the following:    HCT 38.3 (*)    All other components within normal limits  BASIC METABOLIC PANEL - Abnormal; Notable for the following:    Glucose, Bld 125 (*)    All other components within normal limits  POCT I-STAT TROPONIN I   Imaging Review Dg Chest 2 View  01/15/2013   CLINICAL DATA:  Palpitations  EXAM: CHEST  2 VIEW  COMPARISON:  None  FINDINGS: Normal heart size, mediastinal contours, and pulmonary vascularity.  Lungs clear.  No pleural effusion  or pneumothorax.  Bones unremarkable.  IMPRESSION: Normal exam.   Electronically Signed   By: Ulyses Southward M.D.   On: 01/15/2013 23:48    EKG Interpretation    Date/Time:  Tuesday January 15 2013 22:41:57 EST Ventricular Rate:  110 PR Interval:  126 QRS Duration: 88 QT Interval:  326 QTC Calculation: 441 R Axis:   66 Text Interpretation:  Sinus tachycardia Premature atrial complexes No old tracing to compare Otherwise normal ECG Confirmed by Brandee Markin  MD, Dominick Zertuche [phone]) on 01/16/2013 3:58:09 AM           Asymptomatic in ED, some ectopic beats on cardiac monitor but no afib or arrythmia otherwise.   4:30 AM patient is comfortable plan discharge home. He will followup as an outpatient - he'll call today to schedule followup. Referral to cardiology. Strict return precautions verbalized as understood.  MDM  Diagnosis: Palpitations, borderline short PR interval  ECG reviewed - ST in triage, NSR ever since that. PR interval 126.  His x-ray and labs reviewed as above.  Vital signs nurse's notes reviewed and considered.    Sunnie Nielsen, MD 01/16/13 [phone]

## 2013-07-22 ENCOUNTER — Encounter (HOSPITAL_COMMUNITY): Payer: Self-pay | Admitting: Emergency Medicine

## 2013-07-22 DIAGNOSIS — R51 Headache: Secondary | ICD-10-CM | POA: Insufficient documentation

## 2013-07-22 DIAGNOSIS — J45909 Unspecified asthma, uncomplicated: Secondary | ICD-10-CM | POA: Insufficient documentation

## 2013-07-22 DIAGNOSIS — I10 Essential (primary) hypertension: Secondary | ICD-10-CM | POA: Insufficient documentation

## 2013-07-22 NOTE — ED Notes (Signed)
Pt. reports headache onset yesterday morning , denies injury , alert and oriented , slight nausea , no blurred vision .

## 2013-07-23 ENCOUNTER — Emergency Department (HOSPITAL_COMMUNITY)
Admission: EM | Admit: 2013-07-23 | Discharge: 2013-07-23 | Discharge status: Against Medical Advice | Payer: Managed Care, Other (non HMO) | Attending: Emergency Medicine | Admitting: Emergency Medicine

## 2013-07-23 HISTORY — DX: Essential (primary) hypertension: I10

## 2014-12-22 ENCOUNTER — Ambulatory Visit (INDEPENDENT_AMBULATORY_CARE_PROVIDER_SITE_OTHER): Payer: Managed Care, Other (non HMO)

## 2014-12-22 ENCOUNTER — Ambulatory Visit (INDEPENDENT_AMBULATORY_CARE_PROVIDER_SITE_OTHER): Payer: Managed Care, Other (non HMO) | Admitting: Podiatry

## 2014-12-22 ENCOUNTER — Encounter: Payer: Self-pay | Admitting: Podiatry

## 2014-12-22 VITALS — BP 199/123 | HR 88 | Resp 16 | Ht 72.0 in | Wt 335.0 lb

## 2014-12-22 DIAGNOSIS — M779 Enthesopathy, unspecified: Secondary | ICD-10-CM

## 2014-12-22 DIAGNOSIS — M79673 Pain in unspecified foot: Secondary | ICD-10-CM

## 2014-12-22 MED ORDER — DICLOFENAC SODIUM 75 MG PO TBEC: 75.0000 mg | DELAYED_RELEASE_TABLET | Freq: Two times a day (BID) | ORAL | Status: DC

## 2014-12-22 MED ORDER — TRIAMCINOLONE ACETONIDE 10 MG/ML IJ SUSP
10.0000 mg | Freq: Once | INTRAMUSCULAR | Status: AC
  Administered 2014-12-22: 10 mg

## 2014-12-22 NOTE — Progress Notes (Signed)
   Subjective:    Patient ID: Jesse Kailharles Samuel Mcconaughey Jr., male    DOB: Jun 19, 1980, 34 y.o.   MRN: [id number]  HPI Pt presents with bilateral foot pain, lateral side;  x1 month. Pt stated, "left foot pain only last a week".  Pt saw Dr. Remigio Eisenmengerramer, Orthopedic doctor and thought had stress fracture on left foot.   Review of Systems  HENT: Positive for sinus pressure.   Respiratory: Positive for cough.   Musculoskeletal: Positive for gait problem.  All other systems reviewed and are negative.      Objective:   Physical Exam        Assessment & Plan:

## 2014-12-22 NOTE — Progress Notes (Signed)
Subjective:     Patient ID: Jesse Kailharles Samuel Sill Jr., male   DOB: [birth year], 34 y.o.   MRN: [id number]  HPI patient states I'm having 8 good amount of pain in the upper outside of my right foot and my left foot we thought it was a stress fracture but it seems to be doing okay but he can still get sore. I was referred by orthopedic doctor   Review of Systems     Objective:   Physical Exam  Constitutional: He is oriented to person, place, and time.  Cardiovascular: Intact distal pulses.   Musculoskeletal: Normal range of motion.  Neurological: He is oriented to person, place, and time.  Skin: Skin is warm.  Nursing note and vitals reviewed.  neurovascular status found to be intact with muscle strength adequate range of motion within normal limits and patient noted to have discomfort mostly on the right foot in the dorsal lateral side around the base of the fourth and fifth metatarsal peroneal insertion with no indication of muscle strength loss and mild discomfort in the left foot lateral side with minimal swelling noted. Patient does have old orthotics which have not been properly holding up the arch     Assessment:     Probable peroneal tendinitis right with compensatory discomfort in the lateral side of foot secondary to previous left foot pain which is improving    Plan:     H&P and x-rays reviewed. Careful injection administered to the dorsal lateral aspect right foot and around the base of the fifth metatarsal 3 mg Kenalog 5 mg Xylocaine and advised on ice therapy and dispensed fascial brace to hold the lateral side of the foot. Discussed Orthotics and he will bring his old orthotics next visit for evaluation

## 2015-01-05 ENCOUNTER — Ambulatory Visit (INDEPENDENT_AMBULATORY_CARE_PROVIDER_SITE_OTHER): Payer: Managed Care, Other (non HMO) | Admitting: Podiatry

## 2015-01-05 ENCOUNTER — Encounter: Payer: Self-pay | Admitting: Podiatry

## 2015-01-05 VITALS — BP 147/82 | HR 81 | Resp 16

## 2015-01-05 DIAGNOSIS — M779 Enthesopathy, unspecified: Secondary | ICD-10-CM

## 2015-01-06 NOTE — Progress Notes (Signed)
Subjective:     Patient ID: Jesse Kailharles Samuel Langwell Jr., male   DOB: 07-29-1980, 34 y.o.   MRN: [id number]  HPI patient presents stating he's feeling some better but he knows he needs new orthotics due to his long-standing flatfeet.   Review of Systems     Objective:   Physical Exam Neurovascular status intact with no health history changes with moderate flatfoot deformity bilateral and discomfort that is been present for a fairly long time    Assessment:     Fasciitis-like symptoms secondary to foot structure    Plan:     Scanned for custom orthotics today and reappoint when returned

## 2015-01-21 ENCOUNTER — Encounter: Payer: Self-pay | Admitting: Sports Medicine

## 2015-01-21 ENCOUNTER — Ambulatory Visit (INDEPENDENT_AMBULATORY_CARE_PROVIDER_SITE_OTHER): Payer: Managed Care, Other (non HMO) | Admitting: Sports Medicine

## 2015-01-21 DIAGNOSIS — M779 Enthesopathy, unspecified: Secondary | ICD-10-CM

## 2015-01-21 DIAGNOSIS — M2142 Flat foot [pes planus] (acquired), left foot: Secondary | ICD-10-CM

## 2015-01-21 DIAGNOSIS — M79673 Pain in unspecified foot: Secondary | ICD-10-CM

## 2015-01-21 DIAGNOSIS — M2141 Flat foot [pes planus] (acquired), right foot: Secondary | ICD-10-CM | POA: Diagnosis not present

## 2015-01-21 MED ORDER — TRIAMCINOLONE ACETONIDE 10 MG/ML IJ SUSP: 10.0000 mg | Freq: Once | INTRAMUSCULAR | Status: DC

## 2015-01-21 NOTE — Progress Notes (Signed)
Patient ID: Jesse Kail., male   DOB: [birth year], 34 y.o.   MRN: [id number] Subjective: Jesse Pitsenbarger. is a 34 y.o. male patient who returns to office for evaluation of Right foot/ankle pain. Patient complains of progressive pain especially over the last few days that has changed in quality since previous visits. Reports that his pain was on the outside or lateral side of the foot now has moved to the inner portion of the ankle states that he had 1 episode of pain last night and during the day on yesterday that was really bad requiring him to call out from work; states that he started the diclofenac 3 days ago, which seems to be helping decrease his pain and symptoms. Reports that so far other previous treatment have not resolved pain and states that pain now at the inner portion of the ankle is worse with work that requires him to stand on tipey-toes.  Patient denies any other pedal complaints. Denies injury/trip/fall/sprain/any causative factors.   Patient Active Problem List   Diagnosis Date Noted  . Elevated transaminase level 06/21/2012  . Fatty liver 06/21/2012   Current Outpatient Prescriptions on File Prior to Visit  Medication Sig Dispense Refill  . diclofenac (VOLTAREN) 75 MG EC tablet Take 1 tablet (75 mg total) by mouth 2 (two) times daily. 30 tablet 0  . ibuprofen (ADVIL,MOTRIN) 200 MG tablet Take 200 mg by mouth every 6 (six) hours as needed. Pt takes 2 tablets    . UNABLE TO FIND Essential oils - takes liquid - herbal supplement    . UNABLE TO FIND Take 2 tablets by mouth daily. Pt takes Tumeric     No current facility-administered medications on file prior to visit.   No Known Allergies   Objective:  General: Alert and oriented x3 in no acute distress  Dermatology: No open lesions bilateral lower extremities, no webspace macerations, no ecchymosis bilateral, all nails x 10 are well manicured.  Vascular: Dorsalis Pedis and Posterior Tibial pedal pulses 2/4,  Capillary Fill Time 3 seconds,(+) pedal hair growth bilateral, no edema bilateral lower extremities, Temperature gradient within normal limits.  Neurology: Gross sensation intact via light touch bilateral, (- )Tinels sign right foot/ankle.   Musculoskeletal: Mild tenderness with palpation at posterior tibial tendon just behind the medial malleolus on Right, no frank ankle instability or other areas of pain noted on the right foot and ankle, ankle and pedal joint range of motion within normal limits, Pes planus foot type, No pain with calf compression bilateral. Strength within normal limits in all groups bilateral.   Assessment and Plan: Problem List Items Addressed This Visit    None    Visit Diagnoses    Tendonitis    -  Primary    PT    Relevant Medications    triamcinolone acetonide (KENALOG) 10 MG/ML injection 10 mg    Foot pain, unspecified laterality        Relevant Medications    triamcinolone acetonide (KENALOG) 10 MG/ML injection 10 mg    Pes planus of both feet          -Complete examination performed -Previous xrays reviewed -Discussed treatement options or tendinitis likely secondary to previous ankle surgeries and planus foot type -After verbal consent injected 3 mL mixture of lidocaine, Marcaine, dexamethasone phosphate and Kenalog 10 along posterior tibial tendon on right sheath without complication. Post injection care explained and discussed with patient -Dispensed Tri-Lock ankle brace to use daily on right -Recommend  ice, elevation and protection on right, especially at work and to wear good supportive work boots or shoes. Refrain from activities requiring to stand on ball of right foot until ankle gets better and symptoms improve - Recommend to continue with diclofenac with close monitoring of blood pressure if significant elevation or change in blood pressure noted to discontinue medication and to call office for further instructions -Patient to return to office in 3  weeks or sooner if condition worsens. [facility]; will await arrival and incorporate with current treatment plan when appropriate.   Asencion Islamitorya Leelan Rajewski, DPM

## 2015-01-30 ENCOUNTER — Ambulatory Visit: Payer: Managed Care, Other (non HMO) | Admitting: *Deleted

## 2015-01-30 DIAGNOSIS — M779 Enthesopathy, unspecified: Secondary | ICD-10-CM

## 2015-01-30 NOTE — Patient Instructions (Signed)

## 2015-01-30 NOTE — Progress Notes (Signed)
Patient ID: Jesse Kailharles Samuel Tingley Jr., male   DOB: May 26, 1980, 34 y.o.   MRN: [id number] Patient presents for orthotic pick up.  Verbal and written break in and wear instructions given.  Patient will follow up in 4 weeks if symptoms worsen or fail to improve.

## 2015-02-15 ENCOUNTER — Emergency Department (HOSPITAL_COMMUNITY): Payer: Managed Care, Other (non HMO)

## 2015-02-15 ENCOUNTER — Emergency Department (HOSPITAL_COMMUNITY)
Admission: EM | Admit: 2015-02-15 | Discharge: 2015-02-15 | Disposition: A | Discharge status: Home / Self Care | Payer: Managed Care, Other (non HMO) | Attending: Emergency Medicine | Admitting: Emergency Medicine

## 2015-02-15 ENCOUNTER — Encounter (HOSPITAL_COMMUNITY): Payer: Self-pay | Admitting: Emergency Medicine

## 2015-02-15 DIAGNOSIS — K439 Ventral hernia without obstruction or gangrene: Secondary | ICD-10-CM | POA: Diagnosis not present

## 2015-02-15 DIAGNOSIS — R109 Unspecified abdominal pain: Secondary | ICD-10-CM | POA: Diagnosis present

## 2015-02-15 DIAGNOSIS — I10 Essential (primary) hypertension: Secondary | ICD-10-CM | POA: Insufficient documentation

## 2015-02-15 DIAGNOSIS — Z9889 Other specified postprocedural states: Secondary | ICD-10-CM | POA: Diagnosis not present

## 2015-02-15 DIAGNOSIS — J45909 Unspecified asthma, uncomplicated: Secondary | ICD-10-CM | POA: Insufficient documentation

## 2015-02-15 DIAGNOSIS — E669 Obesity, unspecified: Secondary | ICD-10-CM | POA: Diagnosis not present

## 2015-02-15 DIAGNOSIS — Z791 Long term (current) use of non-steroidal anti-inflammatories (NSAID): Secondary | ICD-10-CM | POA: Diagnosis not present

## 2015-02-15 LAB — URINALYSIS, ROUTINE W REFLEX MICROSCOPIC
BILIRUBIN URINE: NEGATIVE
GLUCOSE, UA: NEGATIVE mg/dL
HGB URINE DIPSTICK: NEGATIVE
Ketones, ur: NEGATIVE mg/dL
Leukocytes, UA: NEGATIVE
Nitrite: NEGATIVE
PROTEIN: NEGATIVE mg/dL
Specific Gravity, Urine: 1.018 (ref 1.005–1.030)
pH: 6.5 (ref 5.0–8.0)

## 2015-02-15 LAB — COMPREHENSIVE METABOLIC PANEL
ALT: 59 U/L (ref 17–63)
ANION GAP: 13 (ref 5–15)
AST: 34 U/L (ref 15–41)
Albumin: 3.7 g/dL (ref 3.5–5.0)
Alkaline Phosphatase: 68 U/L (ref 38–126)
BILIRUBIN TOTAL: 0.6 mg/dL (ref 0.3–1.2)
BUN: 10 mg/dL (ref 6–20)
CALCIUM: 9.7 mg/dL (ref 8.9–10.3)
CO2: 28 mmol/L (ref 22–32)
Chloride: 103 mmol/L (ref 101–111)
Creatinine, Ser: 1.13 mg/dL (ref 0.61–1.24)
GFR calc non Af Amer: >60 mL/min (ref >60)
Glucose, Bld: 111 mg/dL — ABNORMAL HIGH (ref 65–99)
Potassium: 4.4 mmol/L (ref 3.5–5.1)
SODIUM: 144 mmol/L (ref 135–145)
TOTAL PROTEIN: 7.9 g/dL (ref 6.5–8.1)

## 2015-02-15 LAB — CBC
HCT: 42.7 % (ref 39.0–52.0)
Hemoglobin: 13.9 g/dL (ref 13.0–17.0)
MCH: 26.8 pg (ref 26.0–34.0)
MCHC: 32.6 g/dL (ref 30.0–36.0)
MCV: 82.3 fL (ref 78.0–100.0)
PLATELETS: 234 10*3/uL (ref 150–400)
RBC: 5.19 MIL/uL (ref 4.22–5.81)
RDW: 15.1 % (ref 11.5–15.5)
WBC: 7.7 10*3/uL (ref 4.0–10.5)

## 2015-02-15 LAB — LIPASE, BLOOD: Lipase: 25 U/L (ref 11–51)

## 2015-02-15 MED ORDER — IOHEXOL 300 MG/ML  SOLN
100.0000 mL | Freq: Once | INTRAMUSCULAR | Status: AC | PRN
  Administered 2015-02-15: 100 mL via INTRAVENOUS

## 2015-02-15 MED ORDER — TRAMADOL HCL 50 MG PO TABS: 50.0000 mg | ORAL_TABLET | Freq: Four times a day (QID) | ORAL | Status: DC | PRN

## 2015-02-15 MED ORDER — SODIUM CHLORIDE 0.9 % IV BOLUS (SEPSIS)
1000.0000 mL | Freq: Once | INTRAVENOUS | Status: AC
Start: 2015-02-15 — End: 2015-02-15
  Administered 2015-02-15: 1000 mL via INTRAVENOUS

## 2015-02-15 MED ORDER — IOHEXOL 300 MG/ML  SOLN: 25.0000 mL | Freq: Once | INTRAMUSCULAR | Status: DC | PRN

## 2015-02-15 NOTE — ED Provider Notes (Signed)
CSN: [id number]     Arrival date & time 02/15/15  1454 History   First MD Initiated Contact with Patient 02/15/15 1814     Chief Complaint  Patient presents with  . Abdominal Pain     (Consider location/radiation/quality/duration/timing/severity/associated sxs/prior Treatment) HPI   35 year old male with history of hypertension and hyperlipidemia presenting for evaluation of abdominal pain. Patient report for nearly a year he has notice some mild pain, discomfort and mild bulging to his anterior abdomen. Today after having an argument with his young son patient did lift up a car seat with his son on it he did not have any significant pain medially after the incident but a few minutes later he noticed increasing sharp achy pain to his mid abdomen with some mild nausea. Pain has now since improved especially with rest. Nausea has improved without any specific treatment. No associated fever, chills, lightheadedness, dizziness, vomiting, diarrhea, chest pain, shortness of breath, or rash. Denies any dysuria or hematuria. He was told that he may have a "weak spot" in his abdomen by his PCP.  He denies any prior history of hernia. No penile pain, testicular or scrotal pain. No prior abdominal surgery.  Past Medical History  Diagnosis Date  . Borderline hypertension   . Hyperlipidemia   . Asthma     as child, no problems now  . Hypertension    Past Surgical History  Procedure Laterality Date  . Hernia repair    . Wisdom tooth extraction    . Wisdom tooth extraction    . Ankle arthroscopy Right 11/29/2012    Procedure: RIGHT ANKLE ARTHROSCOPY WITH EXTENSIVE DEBRIDEMENT;  Surgeon: Loreta Ave, MD;  Location: [facility];  Service: Orthopedics;  Laterality: Right;   Family History  Problem Relation Age of Onset  . Colon cancer Neg Hx    Social History  Substance Use Topics  . Smoking status: Never Smoker   . Smokeless tobacco: None  . Alcohol Use: Yes     Comment:  former use last drink was one month ago: 1-2 drinks on the weekend    Review of Systems  All other systems reviewed and are negative.     Allergies  Review of patient's allergies indicates no known allergies.  Home Medications   Prior to Admission medications   Medication Sig Start Date End Date Taking? Authorizing Provider  diclofenac (VOLTAREN) 75 MG EC tablet Take 1 tablet (75 mg total) by mouth 2 (two) times daily. 12/22/14   Lenn Sink, DPM  ibuprofen (ADVIL,MOTRIN) 200 MG tablet Take 200 mg by mouth every 6 (six) hours as needed. Pt takes 2 tablets    Historical Provider, MD  UNABLE TO FIND Essential oils - takes liquid - herbal supplement    Historical Provider, MD  UNABLE TO FIND Take 2 tablets by mouth daily. Pt takes Tumeric    Historical Provider, MD   BP 132/91 mmHg  Pulse 84  Temp(Src) 97.9 F (36.6 C) (Oral)  Resp 20  Ht 6' (1.829 m)  Wt 160.12 kg  BMI 47.87 kg/m2  SpO2 95% Physical Exam  Constitutional: He is oriented to person, place, and time. He appears well-developed and well-nourished. No distress.  Obese Caucasian male laying in bed in no acute discomfort.  HENT:  Head: Atraumatic.  Eyes: Conjunctivae are normal.  Neck: Neck supple.  Cardiovascular: Normal rate and regular rhythm.   Pulmonary/Chest: Effort normal and breath sounds normal.  Abdominal: Soft. Bowel sounds are normal. He  exhibits no distension. There is tenderness (Tenderness to the area superior to umbilicus with mild firmness bulge in the abdominal wall suspicious of an abdominal wall hernia. No overlying skin changes.). There is no rebound and no guarding.  Negative Murphy sign, no pain at McBurney's point  Neurological: He is alert and oriented to person, place, and time.  Skin: No rash noted.  Psychiatric: He has a normal mood and affect.  Nursing note and vitals reviewed.   ED Course  Procedures (including critical care time) Labs Review Labs Reviewed  COMPREHENSIVE  METABOLIC PANEL - Abnormal; Notable for the following:    Glucose, Bld 111 (*)    All other components within normal limits  URINALYSIS, ROUTINE W REFLEX MICROSCOPIC (NOT AT [facility]) - Abnormal; Notable for the following:    APPearance CLOUDY (*)    All other components within normal limits  LIPASE, BLOOD  CBC    Imaging Review Ct Abdomen Pelvis W Contrast  02/15/2015  CLINICAL DATA:  Abdominal pain for 1 day.  Nausea. EXAM: CT ABDOMEN AND PELVIS WITH CONTRAST TECHNIQUE: Multidetector CT imaging of the abdomen and pelvis was performed using the standard protocol following bolus administration of intravenous contrast. Oral contrast was also administered. CONTRAST:  OMNIPAQUE IOHEXOL 300 MG/ML  SOLN COMPARISON:  None. FINDINGS: Lower chest: Lung bases are clear. There is a rather minimal hiatal hernia. Hepatobiliary: The liver is prominent, measuring 23.1 cm in length. There is hepatic steatosis. No focal liver lesions are identified. Gallbladder wall is not appreciably thickened. There is no biliary duct dilatation. Pancreas: No pancreatic mass or inflammatory focus. Spleen: No splenic lesions are identified. Adrenals/Urinary Tract: Adrenals appear normal bilaterally. Kidneys bilaterally show no mass or hydronephrosis on either side. There is a 7 x 5 mm calculus in the upper pole right kidney with an adjacent 1 mm calculus. On the left, there is a 1 mm calculus in the upper pole region. There is a 1 mm calculus in the upper to midpole region on the left. There is no appreciable ureteral calculus on either side. Urinary bladder is midline with wall thickness within normal limits. Stomach/Bowel: There are multiple sigmoid diverticula without diverticulitis. There is no bowel wall or mesenteric thickening. There is no bowel obstruction. No free air or portal venous air. Vascular/Lymphatic: There is no abdominal aortic aneurysm. Major mesenteric vessels appear patent. No vascular lesions are appreciable.  There is no demonstrable adenopathy in the abdomen or pelvis. Reproductive: Prostate and seminal vesicles appear normal. No pelvic mass or pelvic fluid collection. Other: The appendix appears normal. There is no abscess or ascites in the abdomen or pelvis. There is a focal ventral hernia containing fat but no bowel. The distance between the rectus muscles at the level of the hernia measures 2.3 cm. Musculoskeletal: There are no blastic or lytic bone lesions. No intramuscular or abdominal wall lesions. IMPRESSION: Prominent liver with hepatic steatosis. No focal liver lesions identified. Nonobstructing intrarenal calculi bilaterally, more on the right than on the left. No hydronephrosis or ureteral calculus on either side. Appendix appears normal. No bowel obstruction. No abscess. There are multiple sigmoid diverticula without diverticulitis. Ventral hernia containing only fat.  Rather minimal hiatal hernia. Electronically Signed   By: Bretta Bang III M.D.   On: 02/15/2015 20:36   I have personally reviewed and evaluated these images and lab results as part of my medical decision-making.   EKG Interpretation None      MDM   Final diagnoses:  Ventral  hernia without obstruction or gangrene    BP 135/77 mmHg  Pulse 68  Temp(Src) 97.9 F (36.6 C) (Oral)  Resp 20  Ht 6' (1.829 m)  Wt 160.12 kg  BMI 47.87 kg/m2  SpO2 100%   6:38 PM Patient symptoms suggestive of an abdominal wall hernia. Mild Tenderness on palpation without any peritoneal sign. Patient is resting comfortably. His labs are reassuring. I have low suspicion for hernia incarceration or strangulation at this time. Doubt viral GI. No signs of rashes suggest shingles. I have low suspicion for cholecystitis, appendicitis, or diverticulitis or any other acute emergent abdominal pathology.   7:25 PM Patient requesting for CT scan for further evaluation of his condition. Abdominal and pelvic CT scan with contrast ordered. Pain  medication offer the patient declined.  10:24 PM Abdominal pelvis CT scan demonstrated a ventral hernia, containing only fat. Minimal hiatal hernia. Incidental nonobstructive kidney stones noted. Prominent liver with hepatic steatosis. Mild diverticulosis. I discussed these finding with patient and recommend patient to follow-up with surgery outpatient as needed for further management. Will prescribe a short course of pain medication to use as needed. Recommend avoid heavy lifting as it worsened his hernia. Return precaution discussed.  Fayrene HelperBowie Almarosa Bohac, PA-C 02/15/15 2225  Doug SouSam Jacubowitz, MD 02/16/15 [phone]

## 2015-02-15 NOTE — Discharge Instructions (Signed)
Ventral Hernia A ventral hernia (also called an incisional hernia) is a hernia that occurs at the site of a previous surgical cut (incision) in the abdomen. The abdominal wall spans from your lower chest down to your pelvis. If the abdominal wall is weakened from a surgical incision, a hernia can occur. A hernia is a bulge of bowel or muscle tissue pushing out on the weakened part of the abdominal wall. Ventral hernias can get bigger from straining or lifting. Obese and older people are at higher risk for a ventral hernia. People who develop infections after surgery or require repeat incisions at the same site on the abdomen are also at increased risk. CAUSES  A ventral hernia occurs because of weakness in the abdominal wall at an incision site.  SYMPTOMS  Common symptoms include:  A visible bulge or lump on the abdominal wall.  Pain or tenderness around the lump.  Increased discomfort if you cough or make a sudden movement. If the hernia has blocked part of the intestine, a serious complication can occur (incarcerated or strangulated hernia). This can become a problem that requires emergency surgery because the blood flow to the blocked intestine may be cut off. Symptoms may include:  Feeling sick to your stomach (nauseous).  Throwing up (vomiting).  Stomach swelling (distention) or bloating.  Fever.  Rapid heartbeat. DIAGNOSIS  Your health care provider will take a medical history and perform a physical exam. Various tests may be ordered, such as:  Blood tests.  Urine tests.  Ultrasonography.  X-rays.  Computed tomography (CT). TREATMENT  Watchful waiting may be all that is needed for a smaller hernia that does not cause symptoms. Your health care provider may recommend the use of a supportive belt (truss) that helps to keep the abdominal wall intact. For larger hernias or those that cause pain, surgery to repair the hernia is usually recommended. If a hernia becomes  strangulated, emergency surgery needs to be done right away. HOME CARE INSTRUCTIONS  Avoid putting pressure or strain on the abdominal area.  Avoid heavy lifting.  Use good body positioning for physical tasks. Ask your health care provider about proper body positioning.  Use a supportive belt as directed by your health care provider.  Maintain a healthy weight.  Eat foods that are high in fiber, such as whole grains, fruits, and vegetables. Fiber helps prevent difficult bowel movements (constipation).  Drink enough fluids to keep your urine clear or pale yellow.  Follow up with your health care provider as directed. SEEK MEDICAL CARE IF:   Your hernia seems to be getting larger or more painful. SEEK IMMEDIATE MEDICAL CARE IF:   You have abdominal pain that is sudden and sharp.  Your pain becomes severe.  You have repeated vomiting.  You are sweating a lot.  You notice a rapid heartbeat.  You develop a fever. MAKE SURE YOU:   Understand these instructions.  Will watch your condition.  Will get help right away if you are not doing well or get worse.   This information is not intended to replace advice given to you by your health care provider. Make sure you discuss any questions you have with your health care provider.   Document Released: 01/04/2012 Document Revised: 02/07/2014 Document Reviewed: 01/04/2012 Elsevier Interactive Patient Education 2016 [employer]  

## 2015-02-15 NOTE — ED Notes (Signed)
Pt c/o abdominal pain onset this morning prior to going to church. Pt says for the past year he has had a buldge around his naval area. Pt seen by Dr in past for same and was told he has a weak spot in that area. Pt had nausea with the pain.

## 2015-02-20 ENCOUNTER — Ambulatory Visit: Payer: Managed Care, Other (non HMO) | Admitting: Sports Medicine

## 2015-04-14 ENCOUNTER — Encounter: Payer: Self-pay | Admitting: Sports Medicine

## 2015-04-14 ENCOUNTER — Ambulatory Visit (INDEPENDENT_AMBULATORY_CARE_PROVIDER_SITE_OTHER): Payer: Managed Care, Other (non HMO) | Admitting: Sports Medicine

## 2015-04-14 DIAGNOSIS — M79673 Pain in unspecified foot: Secondary | ICD-10-CM

## 2015-04-14 DIAGNOSIS — M2141 Flat foot [pes planus] (acquired), right foot: Secondary | ICD-10-CM | POA: Diagnosis not present

## 2015-04-14 DIAGNOSIS — M2142 Flat foot [pes planus] (acquired), left foot: Secondary | ICD-10-CM | POA: Diagnosis not present

## 2015-04-14 DIAGNOSIS — M779 Enthesopathy, unspecified: Secondary | ICD-10-CM

## 2015-04-14 NOTE — Progress Notes (Signed)
Patient ID: Jesse Kailharles Samuel Stum Jr., male   DOB: 1980/03/09, 35 y.o.   MRN: [id number]  Subjective: Jesse KailCharles Samuel Fishbaugh Jr. is a 35 y.o. male patient who returns to office for evaluation of Right foot/ankle pain. Patient states that on Sunday morning had an episode of pain which since has gotten better since starting Diclofenac and gold salts/tumeric for inflammation. Denies any known causitive factors.  Patient denies any other pedal complaints.   Admits that he has started to diet and exercise and has been seeing a Landchiropractor.  Patient Active Problem List   Diagnosis Date Noted  . Elevated transaminase level 06/21/2012  . Fatty liver 06/21/2012   Current Outpatient Prescriptions on File Prior to Visit  Medication Sig Dispense Refill  . Coenzyme Q10 (COQ10 PO) Take 1 capsule by mouth at bedtime.    . diclofenac (VOLTAREN) 75 MG EC tablet Take 1 tablet (75 mg total) by mouth 2 (two) times daily. 30 tablet 0  . Multiple Vitamin (MULTIVITAMIN WITH MINERALS) TABS tablet Take 1 tablet by mouth daily as needed (TAKES OCCASIONALLY).    . Omega-3 Fatty Acids (FISH OIL) 1000 MG CAPS Take 1,000 mg by mouth 2 (two) times daily.    . traMADol (ULTRAM) 50 MG tablet Take 1 tablet (50 mg total) by mouth every 6 (six) hours as needed for moderate pain. 15 tablet 0  . UNABLE TO FIND Take 2 tablets by mouth daily. Pt takes Tumeric     Current Facility-Administered Medications on File Prior to Visit  Medication Dose Route Frequency Provider Last Rate Last Dose  . triamcinolone acetonide (KENALOG) 10 MG/ML injection 10 mg  10 mg Other Once [employer] Saylee Sherrill, DPM       No Known Allergies   Objective:  General: Alert and oriented x3 in no acute distress  Dermatology: No open lesions bilateral lower extremities, no webspace macerations, no ecchymosis bilateral, all nails x 10 are well manicured.  Vascular: Dorsalis Pedis and Posterior Tibial pedal pulses 2/4, Capillary Fill Time 3 seconds,(+) pedal hair growth  bilateral, no edema bilateral lower extremities, Temperature gradient within normal limits.  Neurology: Gross sensation intact via light touch bilateral, (- )Tinels sign right foot/ankle.   Musculoskeletal: No tenderness with palpation at posterior tibial  on Right on today's exam, no frank ankle instability or other areas of pain noted on the right foot and ankle, ankle and pedal joint range of motion within normal limits, Pes planus foot type, No pain with calf compression bilateral. Strength within normal limits in all groups bilateral.   Assessment and Plan: Problem List Items Addressed This Visit    None    Visit Diagnoses    Tendonitis    -  Primary    R PT, improving since onset    Pes planus of both feet        Foot pain, unspecified laterality          -Complete examination performed -Discussed treatement options for tendinitis that was likely exacerbated from change in activity with hx of previous ankle surgeries and planus foot type -Cont with Diclofenac until completed; recommend close monitoring of blood pressure if significant elevation or change in blood pressure noted to discontinue medication and to call office for further instructions -Cont with Tri-Lock ankle brace to use daily on right when not in work boots with custom inserts -Recommend ice, elevation and protection on right, especially at work and to wear good supportive work boots or shoes with inserts. Refrain from activities  requiring to stand on ball of right foot until ankle gets better and symptoms improve. -Work note given; may return to regular duty on tomorrow. -Patient to return to office in 6 weeks or sooner if condition worsens.   Asencion Islam, DPM

## 2015-05-25 ENCOUNTER — Ambulatory Visit: Payer: Managed Care, Other (non HMO) | Admitting: Sports Medicine

## 2015-08-30 ENCOUNTER — Other Ambulatory Visit: Payer: Self-pay

## 2015-08-30 ENCOUNTER — Encounter (HOSPITAL_COMMUNITY): Payer: Self-pay

## 2015-08-30 ENCOUNTER — Emergency Department (HOSPITAL_COMMUNITY): Payer: Managed Care, Other (non HMO)

## 2015-08-30 DIAGNOSIS — J45909 Unspecified asthma, uncomplicated: Secondary | ICD-10-CM | POA: Diagnosis not present

## 2015-08-30 DIAGNOSIS — I10 Essential (primary) hypertension: Secondary | ICD-10-CM | POA: Insufficient documentation

## 2015-08-30 DIAGNOSIS — R002 Palpitations: Secondary | ICD-10-CM | POA: Insufficient documentation

## 2015-08-30 LAB — BASIC METABOLIC PANEL
Anion gap: 9 (ref 5–15)
BUN: 9 mg/dL (ref 6–20)
CHLORIDE: 106 mmol/L (ref 101–111)
CO2: 26 mmol/L (ref 22–32)
Calcium: 9.2 mg/dL (ref 8.9–10.3)
Creatinine, Ser: 1.06 mg/dL (ref 0.61–1.24)
GFR calc non Af Amer: >60 mL/min (ref >60)
Glucose, Bld: 133 mg/dL — ABNORMAL HIGH (ref 65–99)
POTASSIUM: 3.7 mmol/L (ref 3.5–5.1)
SODIUM: 141 mmol/L (ref 135–145)

## 2015-08-30 LAB — CBC
HEMATOCRIT: 42.3 % (ref 39.0–52.0)
HEMOGLOBIN: 13.7 g/dL (ref 13.0–17.0)
MCH: 26.6 pg (ref 26.0–34.0)
MCHC: 32.4 g/dL (ref 30.0–36.0)
MCV: 82 fL (ref 78.0–100.0)
Platelets: 227 10*3/uL (ref 150–400)
RBC: 5.16 MIL/uL (ref 4.22–5.81)
RDW: 15.1 % (ref 11.5–15.5)
WBC: 9.8 10*3/uL (ref 4.0–10.5)

## 2015-08-30 LAB — I-STAT TROPONIN, ED: Troponin i, poc: 0 ng/mL (ref 0.00–0.08)

## 2015-08-30 NOTE — ED Triage Notes (Signed)
Pt complaining of irregular heart beat x 1 week. Pt states increased in severity today. Pt complaining of some SOB with episodes. Denies any pain. Pt states increases with activity.

## 2015-08-31 ENCOUNTER — Emergency Department (HOSPITAL_COMMUNITY)
Admission: EM | Admit: 2015-08-31 | Discharge: 2015-08-31 | Disposition: A | Discharge status: Home / Self Care | Payer: Managed Care, Other (non HMO) | Attending: Emergency Medicine | Admitting: Emergency Medicine

## 2015-08-31 DIAGNOSIS — M779 Enthesopathy, unspecified: Secondary | ICD-10-CM

## 2015-08-31 DIAGNOSIS — R002 Palpitations: Secondary | ICD-10-CM

## 2015-08-31 DIAGNOSIS — M79673 Pain in unspecified foot: Secondary | ICD-10-CM

## 2015-08-31 NOTE — ED Provider Notes (Signed)
TIME SEEN: 1:45am  CHIEF COMPLAINT: palpitations  HPI: Patient presents to the emergency department with complaints of heart palpitations. He states he has been experiencing some for many years but they have been more frequent over the past week. He admits to drinking a lot of juice and sweet tea lately. He came to the emergency department today after having episode lasting longer than an hour and felt like his breathing became abnormal. By the time he arrived to the ER it has resolved and he has not had any further episodes since then. He at no time experienced CP. He has not had any cough, cold or fevers. He did not become nausea or diaphoretic. He felt like his rate remained normal. He has never been evaluated for this in the past.  ROS: See HPI Constitutional: no fever  Eyes: no drainage  ENT: no runny nose   Cardiovascular:  no chest pain  Resp:  SOB  GI: no vomiting GU: no dysuria Integumentary: no rash  Allergy: no hives  Musculoskeletal: no leg swelling  Neurological: no slurred speech ROS otherwise negative  PAST MEDICAL HISTORY/PAST SURGICAL HISTORY:  Past Medical History:  Diagnosis Date  . Asthma    as child, no problems now  . Borderline hypertension   . Hyperlipidemia   . Hypertension     MEDICATIONS:  Prior to Admission medications   Medication Sig Start Date End Date Taking? Authorizing Provider  Omega-3 Fatty Acids (FISH OIL) 1000 MG CAPS Take 1,000 mg by mouth 2 (two) times daily.   Yes Historical Provider, MD  diclofenac (VOLTAREN) 75 MG EC tablet Take 1 tablet (75 mg total) by mouth 2 (two) times daily. Patient not taking: Reported on 08/31/2015 12/22/14   Lenn Sink, DPM  traMADol (ULTRAM) 50 MG tablet Take 1 tablet (50 mg total) by mouth every 6 (six) hours as needed for moderate pain. Patient not taking: Reported on 08/31/2015 02/15/15   Fayrene Helper, PA-C    ALLERGIES:  No Known Allergies  SOCIAL HISTORY:  Social History  Substance Use Topics  .  Smoking status: Never Smoker  . Smokeless tobacco: Never Used  . Alcohol use Yes     Comment: former use last drink was one month ago: 1-2 drinks on the weekend    FAMILY HISTORY: Family History  Problem Relation Age of Onset  . Colon cancer Neg Hx     EXAM: BP 124/72   Pulse 69   Temp 97.9 F (36.6 C) (Oral)   Resp 15   Ht 6' (1.829 m)   Wt (!) 147.4 kg   SpO2 93%   BMI 44.08 kg/m  CONSTITUTIONAL: Alert and oriented and responds appropriately to questions. Well-appearing; well-nourished HEAD: Normocephalic EYES: Conjunctivae clear, PERRL ENT: normal nose; no rhinorrhea; moist mucous membranes NECK: Supple, no meningismus, no LAD  CARD: RRR; S1 and S2 appreciated; no murmurs, no clicks, no rubs, no gallops RESP: Normal chest excursion without splinting or tachypnea; breath sounds clear and equal bilaterally; no wheezes, no rhonchi, no rales, no hypoxia or respiratory distress, speaking full sentences ABD/GI: Normal bowel sounds; non-distended; soft, non-tender, no rebound, no guarding, no peritoneal signs BACK:  The back appears normal and is non-tender to palpation, there is no CVA tenderness EXT: Normal ROM in all joints; non-tender to palpation; no edema; normal capillary refill; no cyanosis, no calf tenderness or swelling    SKIN: Normal color for age and race; warm; no rash NEURO: Moves all extremities equally, sensation to light  touch intact diffusely, cranial nerves II through XII intact PSYCH: The patient's mood and manner are appropriate. Grooming and personal hygiene are appropriate.  MEDICAL DECISION MAKING: pt to the ER with concern of increasingly worsening palpitations. He has been trying to loose weight and drinking more juice and sweet tea then normal. He also reports high stress with 5 kids. Has never seen a cardiologist and at no time experienced any pain. The abnormal beat resolved on its own and his work-up in the ED is unremarkable- normal chest, EKG and  troponin. Normal CBC and BMP. Will have patient follow-up with cardiology to discuss using halter monitor. Also discussed return precautions.      Marlon Pel, PA-C 08/31/15 [id number]    Dione Booze, MD 08/31/15 [id number]

## 2015-10-09 ENCOUNTER — Encounter: Payer: Managed Care, Other (non HMO) | Admitting: Interventional Cardiology

## 2015-10-19 ENCOUNTER — Ambulatory Visit: Payer: Managed Care, Other (non HMO) | Admitting: Nurse Practitioner

## 2015-11-06 ENCOUNTER — Encounter: Payer: Self-pay | Admitting: Cardiovascular Disease

## 2015-11-06 ENCOUNTER — Ambulatory Visit (INDEPENDENT_AMBULATORY_CARE_PROVIDER_SITE_OTHER): Payer: Managed Care, Other (non HMO) | Admitting: Cardiovascular Disease

## 2015-11-06 VITALS — BP 134/94 | HR 94 | Ht 72.0 in | Wt 360.8 lb

## 2015-11-06 DIAGNOSIS — R002 Palpitations: Secondary | ICD-10-CM

## 2015-11-06 DIAGNOSIS — I10 Essential (primary) hypertension: Secondary | ICD-10-CM

## 2015-11-06 HISTORY — DX: Morbid (severe) obesity due to excess calories: E66.01

## 2015-11-06 HISTORY — DX: Essential (primary) hypertension: I10

## 2015-11-06 NOTE — Progress Notes (Signed)
Cardiology Office Note   Date:  11/06/2015   ID:  Jesse Kail., DOB Apr 18, 1980, MRN [id number]  PCP:  Erasmo Downer, NP  Cardiologist:   Chilton Si, MD   Chief Complaint  Patient presents with  . New Patient (Initial Visit)    palpitations      History of Present Illness: Jesse Bascomb. is a 35 y.o. male with morbid obesity who presents for an evaluation of palpitations.  Jesse Terry reports several years of intermittent palpitations.  The episodes come and go.  In the past it was once every month or 2. However in July was crying once per week. It lasts for approximately 5 minutes there is mild associated shortness of breath but no chest pain. It feels like his heart is beating out of rhythm and fast. The symptoms seem to come and go randomly. In July it occurred when playing basketball, but also occurs at rest. He does not drink much caffeine. He does wonder if we could be related to stress. He and his wife have 5 children.  He was seen for the symptoms in November He was seen in the ED 08/2015 with palpitations.  No arrhythmias were noted.  Cardiac enzymes were negative and his CBC and basic metabolic panel were unremarkable.  Jesse Terry denies lower extremity edema, orthopnea, or PND. He does not exercise much because he is tired by the time he gets home from work. He and his wife notes that they both struggles with her diet. They're often busy and choose fast food and stent of cooking.   Past Medical History:  Diagnosis Date  . Asthma    as child, no problems now  . Borderline hypertension   . Essential hypertension 11/06/2015  . Hyperlipidemia   . Hypertension   . Morbid obesity (HCC) 11/06/2015    Past Surgical History:  Procedure Laterality Date  . ANKLE ARTHROSCOPY Right 11/29/2012   Procedure: RIGHT ANKLE ARTHROSCOPY WITH EXTENSIVE DEBRIDEMENT;  Surgeon: Loreta Ave, MD;  Location: [facility];  Service: Orthopedics;  Laterality:  Right;  . HERNIA REPAIR    . WISDOM TOOTH EXTRACTION    . WISDOM TOOTH EXTRACTION       Current Outpatient Prescriptions  Medication Sig Dispense Refill  . Omega-3 Fatty Acids (FISH OIL) 1000 MG CAPS Take 1,000 mg by mouth 2 (two) times daily.     No current facility-administered medications for this visit.     Allergies:   Review of patient's allergies indicates no known allergies.    Social History:  The patient  reports that he has never smoked. He has never used smokeless tobacco. He reports that he drinks alcohol. He reports that he does not use drugs.   Family History:  The patient's family history includes Arrhythmia in his father; Arthritis in his paternal grandfather; Diabetes in his mother and paternal grandmother; Heart attack in his maternal grandfather; Heart disease in his maternal grandfather and paternal grandmother; Heart failure in his paternal grandmother; Hypertension in his maternal grandfather and mother; Multiple sclerosis in his maternal grandmother; Stroke in his maternal grandfather.    ROS:  Please see the history of present illness.   Otherwise, review of systems are positive for none.   All other systems are reviewed and negative.    PHYSICAL EXAM: VS:  BP (!) 134/94   Pulse 94   Ht 6' (1.829 m)   Wt (!) 360 lb 12.8 oz (163.7 kg)  BMI 48.93 kg/m  , BMI Body mass index is 48.93 kg/m. GENERAL:  Well appearing HEENT:  Pupils equal round and reactive, fundi not visualized, oral mucosa unremarkable NECK:  No jugular venous distention, waveform within normal limits, carotid upstroke brisk and symmetric, no bruits, no thyromegaly LYMPHATICS:  No cervical adenopathy LUNGS:  Clear to auscultation bilaterally HEART:  RRR.  PMI not displaced or sustained,S1 and S2 within normal limits, no S3, no S4, no clicks, no rubs, no murmurs ABD:  Flat, positive bowel sounds normal in frequency in pitch, no bruits, no rebound, no guarding, no midline pulsatile mass, no  hepatomegaly, no splenomegaly EXT:  2 plus pulses throughout, no edema, no cyanosis no clubbing SKIN:  No rashes no nodules NEURO:  Cranial nerves II through XII grossly intact, motor grossly intact throughout PSYCH:  Cognitively intact, oriented to person place and time   EKG:  EKG is ordered today. The ekg ordered today demonstrates sinus rhythm.  Rate 094 bpm.     Recent Labs: 02/15/2015: ALT 59 08/30/2015: BUN 9; Creatinine, Ser 1.06; Hemoglobin 13.7; Platelets 227; Potassium 3.7; Sodium 141    Lipid Panel No results found for: CHOL, TRIG, HDL, CHOLHDL, VLDL, LDLCALC, LDLDIRECT    Wt Readings from Last 3 Encounters:  11/06/15 (!) 360 lb 12.8 oz (163.7 kg)  08/30/15 (!) 325 lb (147.4 kg)  02/15/15 (!) 353 lb (160.1 kg)      ASSESSMENT AND PLAN:  # Hypertension:  Jesse Terry's blood pressure is elevated today and has been in the past as well.  He is not interested in taking medication at this time.  We discussed the importance of limiting salt intake and increasing exercise to At least 30-40 minutes most days of the week. He expressed understanding and will commit to this over the next 3 months.  # Palpitations:  Jesse Terry reports recurrent episodes of palpitations but this has not occurred recently.  We discussed the possibility of wearing a monitor and will call if it starts back more regularly.  we will check a TSH and free T4.   # ?OSA: After his appointment it was noted that his Epworth Sleepiness Scale was 16, which is concerning for sleep apnea. This could explain both palpitations and hypertension. We will  discuss this at follow-up.  Current medicines are reviewed at length with the patient today.  The patient does not have concerns regarding medicines.  The following changes have been made:  no change  Labs/ tests ordered today include:   Orders Placed This Encounter  Procedures  . TSH  . T4, free  . EKG 12-Lead     Disposition:   FU with Maja Mccaffery C. Duke Salviaandolph, MD,  [facility] in 3 months.     This note was written with the assistance of speech recognition software.  Please excuse any transcriptional errors.  Signed, Flem Enderle C. Duke Salviaandolph, MD, [facility]  11/06/2015 6:03 PM    [facility]

## 2015-11-06 NOTE — Patient Instructions (Signed)
Medication Instructions:  Your physician recommends that you continue on your current medications as directed. Please refer to the Current Medication list given to you today.  Labwork: Your physician recommends that you have lab work today: TSH and Free T4  Testing/Procedures: No new orders.   Follow-Up: Your physician recommends that you schedule a follow-up appointment in: 3 MONTHS with Dr Duke Salvia   Any Other Special Instructions Will Be Listed Below (If Applicable).  Your physician discussed the importance of regular exercise and recommended that you start or continue a regular exercise program for good health.   If you need a refill on your cardiac medications before your next appointment, please call your pharmacy.   Low-Sodium Eating Plan Sodium raises blood pressure and causes water to be held in the body. Getting less sodium from food will help lower your blood pressure, reduce any swelling, and protect your heart, liver, and kidneys. We get sodium by adding salt (sodium chloride) to food. Most of our sodium comes from canned, boxed, and frozen foods. Restaurant foods, fast foods, and pizza are also very high in sodium. Even if you take medicine to lower your blood pressure or to reduce fluid in your body, getting less sodium from your food is important. WHAT IS MY PLAN? Most people should limit their sodium intake to 2,300 mg a day. Your health care provider recommends that you limit your sodium intake to __________ a day.  WHAT DO I NEED TO KNOW ABOUT THIS EATING PLAN? For the low-sodium eating plan, you will follow these general guidelines:  Choose foods with a % Daily Value for sodium of less than 5% (as listed on the food label).   Use salt-free seasonings or herbs instead of table salt or sea salt.   Check with your health care provider or pharmacist before using salt substitutes.   Eat fresh foods.  Eat more vegetables and fruits.  Limit canned vegetables. If  you do use them, rinse them well to decrease the sodium.   Limit cheese to 1 oz (28 g) per day.   Eat lower-sodium products, often labeled as "lower sodium" or "no salt added."  Avoid foods that contain monosodium glutamate (MSG). MSG is sometimes added to [place] food and some canned foods.  Check food labels (Nutrition Facts labels) on foods to learn how much sodium is in one serving.  Eat more home-cooked food and less restaurant, buffet, and fast food.  When eating at a restaurant, ask that your food be prepared with less salt, or no salt if possible.  HOW DO I READ FOOD LABELS FOR SODIUM INFORMATION? The Nutrition Facts label lists the amount of sodium in one serving of the food. If you eat more than one serving, you must multiply the listed amount of sodium by the number of servings. Food labels may also identify foods as:  Sodium free--Less than 5 mg in a serving.  Very low sodium--35 mg or less in a serving.  Low sodium--140 mg or less in a serving.  Light in sodium--50% less sodium in a serving. For example, if a food that usually has 300 mg of sodium is changed to become light in sodium, it will have 150 mg of sodium.  Reduced sodium--25% less sodium in a serving. For example, if a food that usually has 400 mg of sodium is changed to reduced sodium, it will have 300 mg of sodium. WHAT FOODS CAN I EAT? Grains Low-sodium cereals, including oats, puffed wheat and rice, and  shredded wheat cereals. Low-sodium crackers. Unsalted rice and pasta. Lower-sodium bread.  Vegetables Frozen or fresh vegetables. Low-sodium or reduced-sodium canned vegetables. Low-sodium or reduced-sodium tomato sauce and paste. Low-sodium or reduced-sodium tomato and vegetable juices.  Fruits Fresh, frozen, and canned fruit. Fruit juice.  Meat and Other Protein Products Low-sodium canned tuna and salmon. Fresh or frozen meat, poultry, seafood, and fish. Lamb. Unsalted nuts. Dried beans, peas,  and lentils without added salt. Unsalted canned beans. Homemade soups without salt. Eggs.  Dairy Milk. Soy milk. Ricotta cheese. Low-sodium or reduced-sodium cheeses. Yogurt.  Condiments Fresh and dried herbs and spices. Salt-free seasonings. Onion and garlic powders. Low-sodium varieties of mustard and ketchup. Fresh or refrigerated horseradish. Lemon juice.  Fats and Oils Reduced-sodium salad dressings. Unsalted butter.  Other Unsalted popcorn and pretzels.  The items listed above may not be a complete list of recommended foods or beverages. Contact your dietitian for more options. WHAT FOODS ARE NOT RECOMMENDED? Grains Instant hot cereals. Bread stuffing, pancake, and biscuit mixes. Croutons. Seasoned rice or pasta mixes. Noodle soup cups. Boxed or frozen macaroni and cheese. Self-rising flour. Regular salted crackers. Vegetables Regular canned vegetables. Regular canned tomato sauce and paste. Regular tomato and vegetable juices. Frozen vegetables in sauces. Salted JamaicaFrench fries. Olives. Rosita FirePickles. Relishes. Sauerkraut. Salsa. Meat and Other Protein Products Salted, canned, smoked, spiced, or pickled meats, seafood, or fish. Bacon, ham, sausage, hot dogs, corned beef, chipped beef, and packaged luncheon meats. Salt pork. Jerky. Pickled herring. Anchovies, regular canned tuna, and sardines. Salted nuts. Dairy Processed cheese and cheese spreads. Cheese curds. Blue cheese and cottage cheese. Buttermilk.  Condiments Onion and garlic salt, seasoned salt, table salt, and sea salt. Canned and packaged gravies. Worcestershire sauce. Tartar sauce. Barbecue sauce. Teriyaki sauce. Soy sauce, including reduced sodium. Steak sauce. Fish sauce. Oyster sauce. Cocktail sauce. Horseradish that you find on the shelf. Regular ketchup and mustard. Meat flavorings and tenderizers. Bouillon cubes. Hot sauce. Tabasco sauce. Marinades. Taco seasonings. Relishes. Fats and Oils Regular salad dressings.  Salted butter. Margarine. Ghee. Bacon fat.  Other Potato and tortilla chips. Corn chips and puffs. Salted popcorn and pretzels. Canned or dried soups. Pizza. Frozen entrees and pot pies.  The items listed above may not be a complete list of foods and beverages to avoid. Contact your dietitian for more information.   This information is not intended to replace advice given to you by your health care provider. Make sure you discuss any questions you have with your health care provider.   Document Released: 07/09/2001 Document Revised: 02/07/2014 Document Reviewed: 11/21/2012 Elsevier Interactive Patient Education [employer]2016 Elsevier Inc.

## 2016-05-23 ENCOUNTER — Emergency Department (HOSPITAL_COMMUNITY): Payer: Managed Care, Other (non HMO)

## 2016-05-23 ENCOUNTER — Encounter (HOSPITAL_COMMUNITY): Payer: Self-pay | Admitting: *Deleted

## 2016-05-23 ENCOUNTER — Emergency Department (HOSPITAL_COMMUNITY)
Admission: EM | Admit: 2016-05-23 | Discharge: 2016-05-23 | Disposition: A | Discharge status: Home / Self Care | Payer: Managed Care, Other (non HMO) | Attending: Emergency Medicine | Admitting: Emergency Medicine

## 2016-05-23 DIAGNOSIS — I10 Essential (primary) hypertension: Secondary | ICD-10-CM | POA: Insufficient documentation

## 2016-05-23 DIAGNOSIS — N200 Calculus of kidney: Secondary | ICD-10-CM | POA: Insufficient documentation

## 2016-05-23 DIAGNOSIS — R109 Unspecified abdominal pain: Secondary | ICD-10-CM | POA: Diagnosis present

## 2016-05-23 DIAGNOSIS — Z79899 Other long term (current) drug therapy: Secondary | ICD-10-CM | POA: Insufficient documentation

## 2016-05-23 DIAGNOSIS — J45909 Unspecified asthma, uncomplicated: Secondary | ICD-10-CM | POA: Diagnosis not present

## 2016-05-23 LAB — COMPREHENSIVE METABOLIC PANEL
ALT: 35 U/L (ref 17–63)
ANION GAP: 13 (ref 5–15)
AST: 25 U/L (ref 15–41)
Albumin: 4 g/dL (ref 3.5–5.0)
Alkaline Phosphatase: 68 U/L (ref 38–126)
BILIRUBIN TOTAL: 0.6 mg/dL (ref 0.3–1.2)
BUN: 16 mg/dL (ref 6–20)
CO2: 22 mmol/L (ref 22–32)
Calcium: 9.2 mg/dL (ref 8.9–10.3)
Chloride: 104 mmol/L (ref 101–111)
Creatinine, Ser: 1.2 mg/dL (ref 0.61–1.24)
GFR calc Af Amer: >60 mL/min (ref >60)
Glucose, Bld: 150 mg/dL — ABNORMAL HIGH (ref 65–99)
POTASSIUM: 4.1 mmol/L (ref 3.5–5.1)
Sodium: 139 mmol/L (ref 135–145)
TOTAL PROTEIN: 7.8 g/dL (ref 6.5–8.1)

## 2016-05-23 LAB — CBC
HEMATOCRIT: 42 % (ref 39.0–52.0)
HEMOGLOBIN: 13.8 g/dL (ref 13.0–17.0)
MCH: 26.5 pg (ref 26.0–34.0)
MCHC: 32.9 g/dL (ref 30.0–36.0)
MCV: 80.6 fL (ref 78.0–100.0)
Platelets: 239 10*3/uL (ref 150–400)
RBC: 5.21 MIL/uL (ref 4.22–5.81)
RDW: 15.1 % (ref 11.5–15.5)
WBC: 12.7 10*3/uL — AB (ref 4.0–10.5)

## 2016-05-23 LAB — LIPASE, BLOOD: LIPASE: 21 U/L (ref 11–51)

## 2016-05-23 MED ORDER — KETOROLAC TROMETHAMINE 60 MG/2ML IM SOLN
60.0000 mg | Freq: Once | INTRAMUSCULAR | Status: AC
  Administered 2016-05-23: 60 mg via INTRAMUSCULAR
  Filled 2016-05-23: qty 2

## 2016-05-23 MED ORDER — HYDROCODONE-ACETAMINOPHEN 5-325 MG PO TABS: 1.0000 | ORAL_TABLET | Freq: Four times a day (QID) | ORAL | 0 refills | Status: DC | PRN

## 2016-05-23 MED ORDER — TAMSULOSIN HCL 0.4 MG PO CAPS: 0.4000 mg | ORAL_CAPSULE | Freq: Every day | ORAL | 0 refills | Status: DC

## 2016-05-23 MED ORDER — HYDROCODONE-ACETAMINOPHEN 5-325 MG PO TABS
1.0000 | ORAL_TABLET | Freq: Once | ORAL | Status: AC
  Administered 2016-05-23: 1 via ORAL
  Filled 2016-05-23: qty 1

## 2016-05-23 NOTE — ED Notes (Signed)
Pt returned from ct

## 2016-05-23 NOTE — ED Triage Notes (Signed)
Pt states hat he began having rt flank and abdomen pain. Pt states that he began having N/V today. Pt states that he went to [facility] and had a urinalysis done the "showed blood". Pt states that he has had kidney stones in the past.

## 2016-05-23 NOTE — ED Provider Notes (Signed)
[facility]-EMERGENCY DEPT Provider Note   CSN: [id number] Arrival date & time: 05/23/16  1349   By signing my name below, I, Soijett Blue, attest that this documentation has been prepared under the direction and in the presence of Gwyneth Sprout, MD. Electronically Signed: Soijett Blue, ED Scribe. 05/23/16. 4:56 PM.  History   Chief Complaint Chief Complaint  Patient presents with  . Flank Pain    HPI Jesse Terry. is a 36 y.o. male with a PMHx of HTN, who presents to the Emergency Department complaining of right flank pain onset yesterday. Pt right flank pain radiates to his right sided abdomen and he states that he has a past hx of kidney stones. Pt reports associated vomiting, intermittent diaphoresis, and right testicular pain. Pt has not tried any medications for the relief of his symptoms. He was evaluated at Urgent Care and informed to come into the ED due to there being blood found in his urine. He denies testicular swelling, dysuria, fever, and any other symptoms. He reports that he has a hx of HTN, but doesn't take medications for his symptoms. Denies PMHx of DM or high cholesterol.     The history is provided by the patient. No language interpreter was used.    Past Medical History:  Diagnosis Date  . Asthma    as child, no problems now  . Borderline hypertension   . Essential hypertension 11/06/2015  . Hyperlipidemia   . Hypertension   . Morbid obesity (HCC) 11/06/2015    Patient Active Problem List   Diagnosis Date Noted  . Morbid obesity (HCC) 11/06/2015  . Essential hypertension 11/06/2015  . Elevated transaminase level 06/21/2012  . Fatty liver 06/21/2012    Past Surgical History:  Procedure Laterality Date  . ANKLE ARTHROSCOPY Right 11/29/2012   Procedure: RIGHT ANKLE ARTHROSCOPY WITH EXTENSIVE DEBRIDEMENT;  Surgeon: Loreta Ave, MD;  Location: [facility];  Service: Orthopedics;  Laterality: Right;  . HERNIA REPAIR    . WISDOM  TOOTH EXTRACTION    . WISDOM TOOTH EXTRACTION         Home Medications    Prior to Admission medications   Medication Sig Start Date End Date Taking? Authorizing Provider  Omega-3 Fatty Acids (FISH OIL) 1000 MG CAPS Take 1,000 mg by mouth 2 (two) times daily.    Historical Provider, MD    Family History Family History  Problem Relation Age of Onset  . Diabetes Mother   . Hypertension Mother   . Arrhythmia Father   . Multiple sclerosis Maternal Grandmother   . Heart disease Maternal Grandfather   . Hypertension Maternal Grandfather   . Stroke Maternal Grandfather   . Heart attack Maternal Grandfather   . Heart disease Paternal Grandmother   . Heart failure Paternal Grandmother   . Diabetes Paternal Grandmother   . Arthritis Paternal Grandfather   . Colon cancer Neg Hx     Social History Social History  Substance Use Topics  . Smoking status: Never Smoker  . Smokeless tobacco: Never Used  . Alcohol use Yes     Comment: former use last drink was one month ago: 1-2 drinks on the weekend     Allergies   Patient has no known allergies.   Review of Systems Review of Systems  All other systems reviewed and are negative.    Physical Exam Updated Vital Signs BP (!) 148/94 (BP Location: Left Arm)   Pulse 80   Temp 97.9 F (36.6 C) (  Oral)   Resp 16   Ht 6' (1.829 m)   Wt (!) 358 lb (162.4 kg)   SpO2 96%   BMI 48.55 kg/m   Physical Exam  Constitutional: He is oriented to person, place, and time. He appears well-developed and well-nourished. No distress.  HENT:  Head: Normocephalic and atraumatic.  Eyes: EOM are normal.  Neck: Neck supple.  Cardiovascular: Normal rate, regular rhythm and normal heart sounds.  Exam reveals no gallop and no friction rub.   No murmur heard. Pulmonary/Chest: Effort normal and breath sounds normal. No respiratory distress. He has no wheezes. He has no rales.  Abdominal: Soft. He exhibits no distension. There is tenderness. There  is CVA tenderness.  Mild right CVA tenderness. Mild right mid abdomen tenderness.   Musculoskeletal: Normal range of motion.  Neurological: He is alert and oriented to person, place, and time.  Skin: Skin is warm and dry.  Psychiatric: He has a normal mood and affect. His behavior is normal.  Nursing note and vitals reviewed.    ED Treatments / Results  DIAGNOSTIC STUDIES: Oxygen Saturation is 96% on RA, nl by my interpretation.    COORDINATION OF CARE: 4:56 PM Discussed treatment plan with pt at bedside which includes UA, labs, and pt agreed to plan.   Labs (all labs ordered are listed, but only abnormal results are displayed) Labs Reviewed  COMPREHENSIVE METABOLIC PANEL - Abnormal; Notable for the following:       Result Value   Glucose, Bld 150 (*)    All other components within normal limits  CBC - Abnormal; Notable for the following:    WBC 12.7 (*)    All other components within normal limits  LIPASE, BLOOD  URINALYSIS, ROUTINE W REFLEX MICROSCOPIC    EKG  EKG Interpretation None       Radiology Ct Renal Stone Study  Result Date: 05/23/2016 CLINICAL DATA:  Right flank pain beginning yesterday. Pain radiates to the right testicle. Positive hematuria. EXAM: CT ABDOMEN AND PELVIS WITHOUT CONTRAST TECHNIQUE: Multidetector CT imaging of the abdomen and pelvis was performed following the standard protocol without IV contrast. COMPARISON:  02/15/2015 FINDINGS: Lower chest: Normal Hepatobiliary: Fatty liver as seen previously. No calcified gallstones. Pancreas: Normal Spleen: Normal Adrenals/Urinary Tract: Adrenal glands are normal. 2 mm nonobstructing stone upper pole right kidney. Five by 6 mm stone previously seen in the upper pole of the right kidney has moved to the proximal ureter. Mild right hydronephrosis. Multiple tiny nonobstructing renal calculi on the left. No stone in the bladder. Stomach/Bowel: Normal Vascular/Lymphatic: Normal Reproductive: Normal Other: No free  fluid or air.  Ventral hernia containing only fat. Musculoskeletal: Normal IMPRESSION: 5 x 6 mm stone previously seen in the upper pole the right kidney has passed into the proximal right ureter. Mild right hydronephrosis. Other tiny nonobstructing renal calculi bilaterally. Fatty liver as seen previously. Ventral/periumbilical hernia containing only fat. Electronically Signed   By: Paulina Fusi M.D.   On: 05/23/2016 17:45    Procedures Procedures (including critical care time)  Medications Ordered in ED Medications - No data to display   Initial Impression / Assessment and Plan / ED Course  I have reviewed the triage vital signs and the nursing notes.  Pertinent labs & imaging results that were available during my care of the patient were reviewed by me and considered in my medical decision making (see chart for details).     Pt with symptoms consistent with kidney stone.  Denies infectious  sx, or GI symptoms.  Low concern for diverticulitis and no risk factors or history suggestive of AAA.  No hx suggestive of GU source (discharge) and otherwise pt is healthy.  Will hydrate, treat pain and ensure no infection with UA, CBC, CMP and will get stone study to further eval.  Labs wnl.  UA done at urgent care showed blood only. Pt given toradol for pain.  6:19 PM CT showing moderate sized stone in the proximal right ureter.  5 x 6 mm. Patient started on Flomax and given pain control. Also given urology follow-up  Final Clinical Impressions(s) / ED Diagnoses   Final diagnoses:  Kidney stone    New Prescriptions New Prescriptions   HYDROCODONE-ACETAMINOPHEN (NORCO/VICODIN) 5-325 MG TABLET    Take 1-2 tablets by mouth every 6 (six) hours as needed for severe pain.   TAMSULOSIN (FLOMAX) 0.4 MG CAPS CAPSULE    Take 1 capsule (0.4 mg total) by mouth daily after supper.   I personally performed the services described in this documentation, which was scribed in my presence.  The recorded  information has been reviewed and considered.    Gwyneth Sprout, MD 05/23/16 1821

## 2016-05-23 NOTE — ED Notes (Signed)
To ct

## 2016-05-25 ENCOUNTER — Encounter (HOSPITAL_COMMUNITY): Payer: Self-pay | Admitting: General Practice

## 2016-05-25 ENCOUNTER — Other Ambulatory Visit: Payer: Self-pay | Admitting: Urology

## 2016-05-26 ENCOUNTER — Other Ambulatory Visit: Payer: Self-pay | Admitting: Urology

## 2016-05-30 ENCOUNTER — Ambulatory Visit (HOSPITAL_COMMUNITY): Payer: Managed Care, Other (non HMO)

## 2016-05-30 ENCOUNTER — Encounter (HOSPITAL_COMMUNITY): Payer: Self-pay | Admitting: General Practice

## 2016-05-30 ENCOUNTER — Encounter (HOSPITAL_COMMUNITY)
Admission: RE | Disposition: A | Discharge status: Home / Self Care | Payer: Self-pay | Source: Ambulatory Visit | Attending: Urology

## 2016-05-30 ENCOUNTER — Ambulatory Visit (HOSPITAL_COMMUNITY)
Admission: RE | Admit: 2016-05-30 | Discharge: 2016-05-30 | Disposition: A | Discharge status: Home / Self Care | Payer: Managed Care, Other (non HMO) | Source: Ambulatory Visit | Attending: Urology | Admitting: Urology

## 2016-05-30 DIAGNOSIS — Z79899 Other long term (current) drug therapy: Secondary | ICD-10-CM | POA: Diagnosis not present

## 2016-05-30 DIAGNOSIS — Z6841 Body Mass Index (BMI) 40.0 and over, adult: Secondary | ICD-10-CM | POA: Diagnosis not present

## 2016-05-30 DIAGNOSIS — N201 Calculus of ureter: Secondary | ICD-10-CM | POA: Insufficient documentation

## 2016-05-30 HISTORY — DX: Personal history of urinary calculi: Z87.442

## 2016-05-30 HISTORY — PX: EXTRACORPOREAL SHOCK WAVE LITHOTRIPSY: SHX1557

## 2016-05-30 HISTORY — DX: Gout, unspecified: M10.9

## 2016-05-30 SURGERY — LITHOTRIPSY, ESWL
Anesthesia: LOCAL | Laterality: Right

## 2016-05-30 MED ORDER — SODIUM CHLORIDE 0.9 % IV SOLN
INTRAVENOUS | Status: DC
  Administered 2016-05-30: 15:00:00 via INTRAVENOUS

## 2016-05-30 MED ORDER — DIPHENHYDRAMINE HCL 25 MG PO CAPS
25.0000 mg | ORAL_CAPSULE | ORAL | Status: AC
  Administered 2016-05-30: 25 mg via ORAL
  Filled 2016-05-30: qty 1

## 2016-05-30 MED ORDER — CIPROFLOXACIN HCL 500 MG PO TABS
500.0000 mg | ORAL_TABLET | ORAL | Status: AC
  Administered 2016-05-30: 500 mg via ORAL
  Filled 2016-05-30: qty 1

## 2016-05-30 MED ORDER — DIAZEPAM 5 MG PO TABS
10.0000 mg | ORAL_TABLET | ORAL | Status: AC
  Administered 2016-05-30: 10 mg via ORAL
  Filled 2016-05-30: qty 2

## 2016-05-30 NOTE — Progress Notes (Signed)
When asked about spiritual concerns or practices staff would need to be aware of, Pt requests no pork products / bi-products of any kind in food or medicines.

## 2016-05-31 ENCOUNTER — Encounter (HOSPITAL_COMMUNITY): Payer: Self-pay | Admitting: Urology

## 2019-02-03 IMAGING — CT CT RENAL STONE PROTOCOL
2 of 4 series · 16 of 46 positions shown, 18 images · non-contrast
Comparison: 02/15/2015

CLINICAL DATA: Right flank pain beginning yesterday. Pain radiates
to the right testicle. Positive hematuria.

EXAM:
CT ABDOMEN AND PELVIS WITHOUT CONTRAST
TECHNIQUE: Multidetector CT imaging of the abdomen and pelvis was performed
following the standard protocol without IV contrast.

[Series 4: renal stone 5.0 · axial · 0.98mm/px · z∈[-626,-106]mm · 13 of 114 slices shown, 15 images]
[im 5/114  soft-tissue]
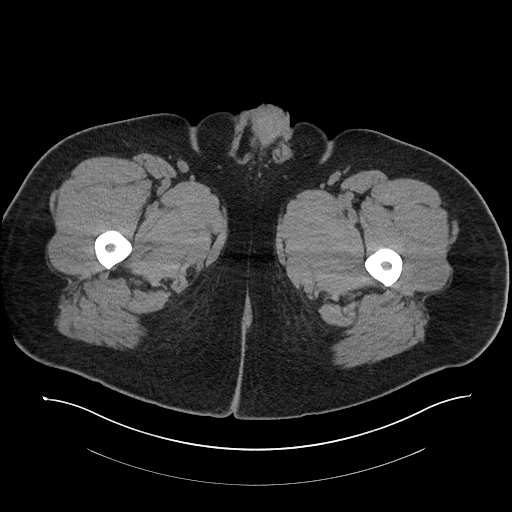
[im 5/114  bone]
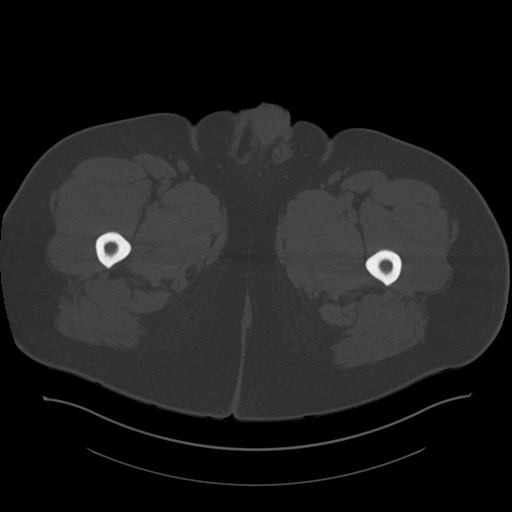
[im 15/114  soft-tissue]
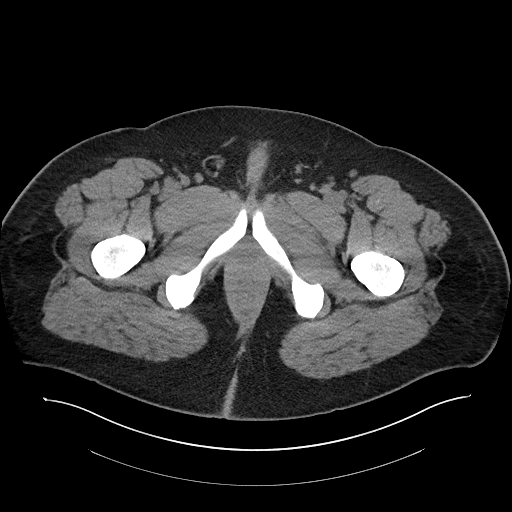
[im 25/114  soft-tissue]
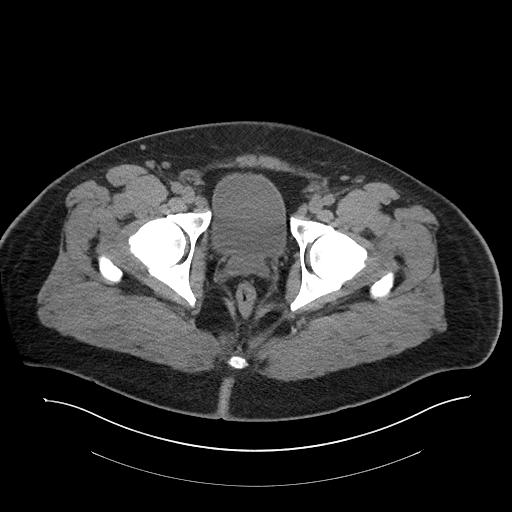
[im 30/114  soft-tissue]
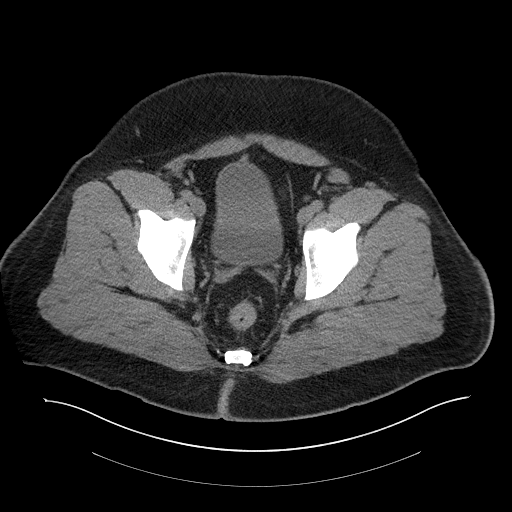
[im 40/114  soft-tissue]
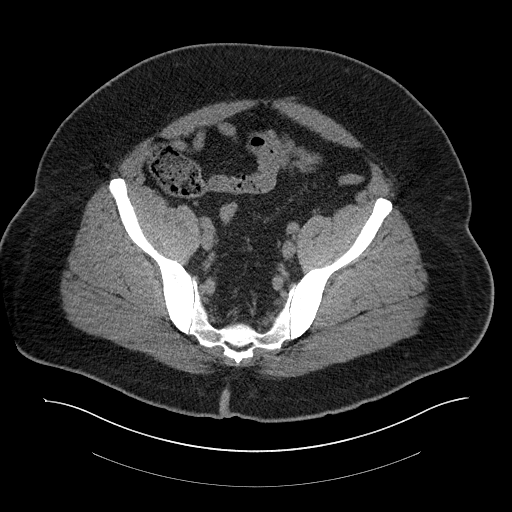
[im 50/114  soft-tissue]
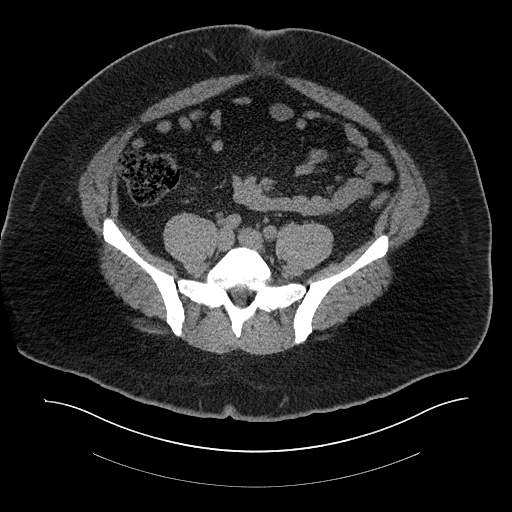
[im 59/114  soft-tissue]
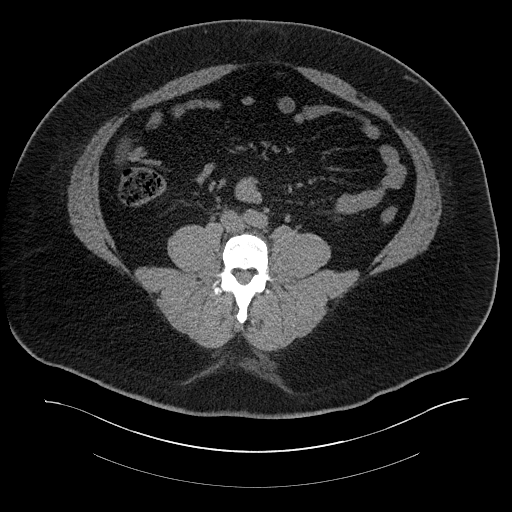
[im 64/114  soft-tissue]
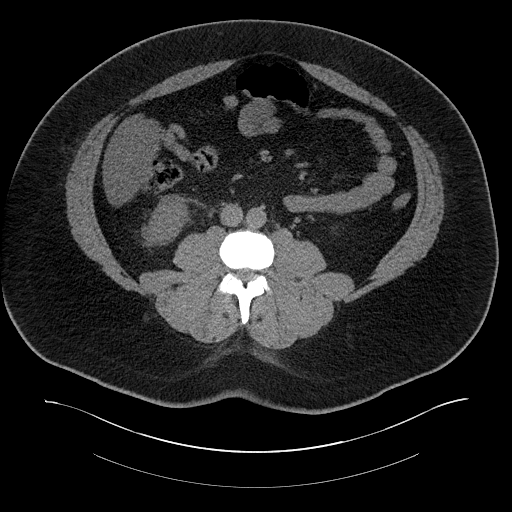
[im 74/114  soft-tissue]
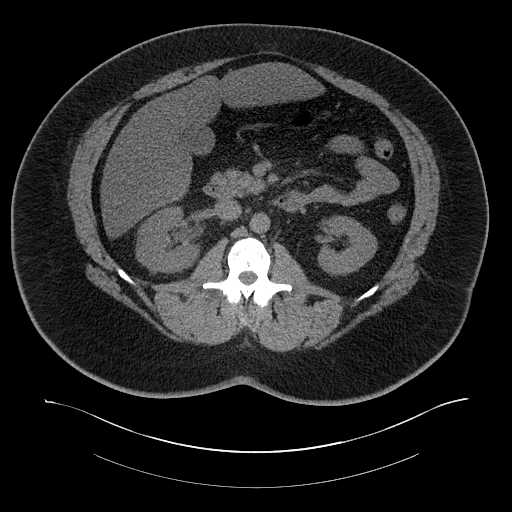
[im 74/114  bone]
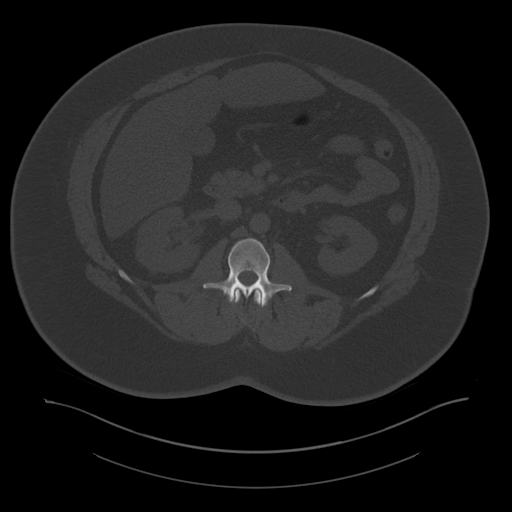
[im 84/114  soft-tissue]
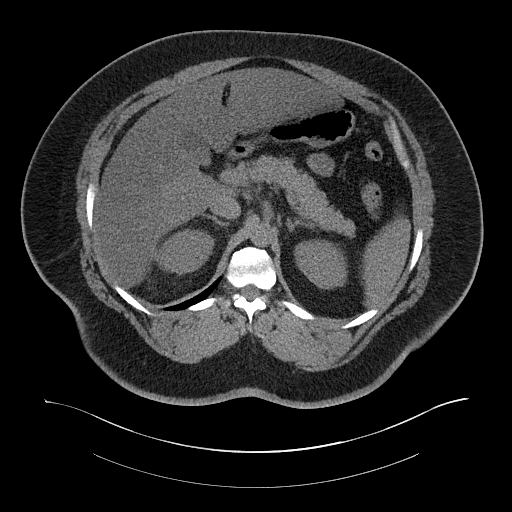
[im 89/114  soft-tissue]
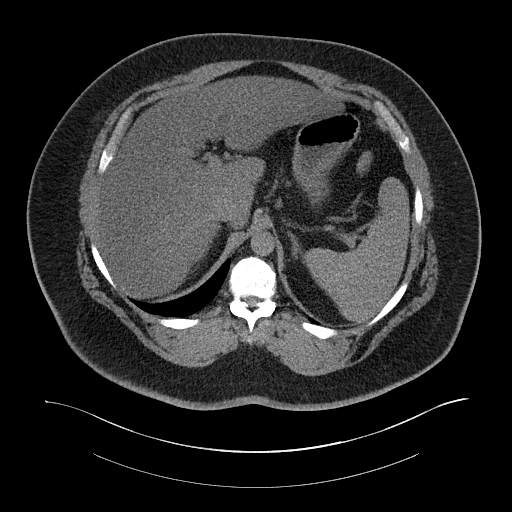
[im 99/114  soft-tissue]
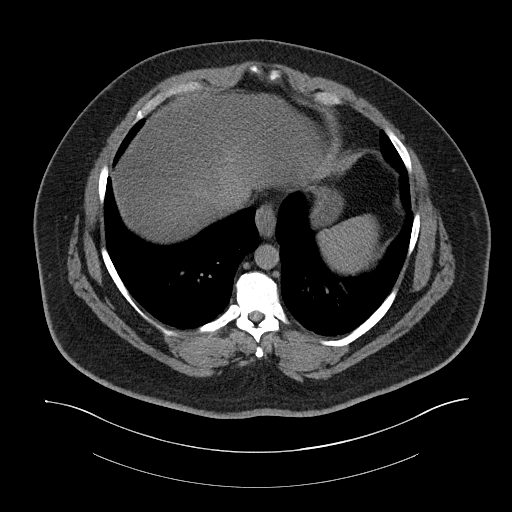
[im 109/114  soft-tissue]
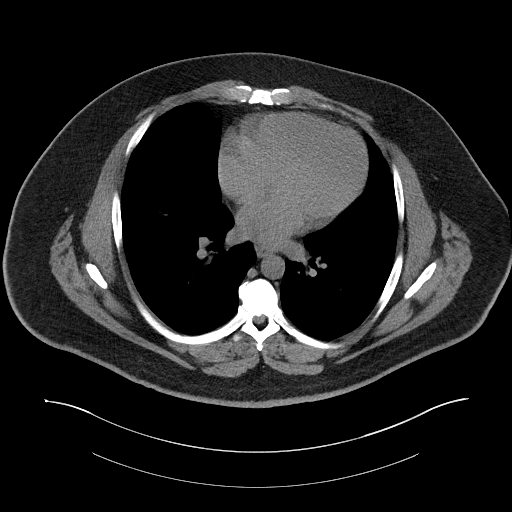

[Series 5: renal stone 3.0 cor · coronal · 0.90mm/px · 3 of 127 slices shown]
[im 43/127  soft-tissue]
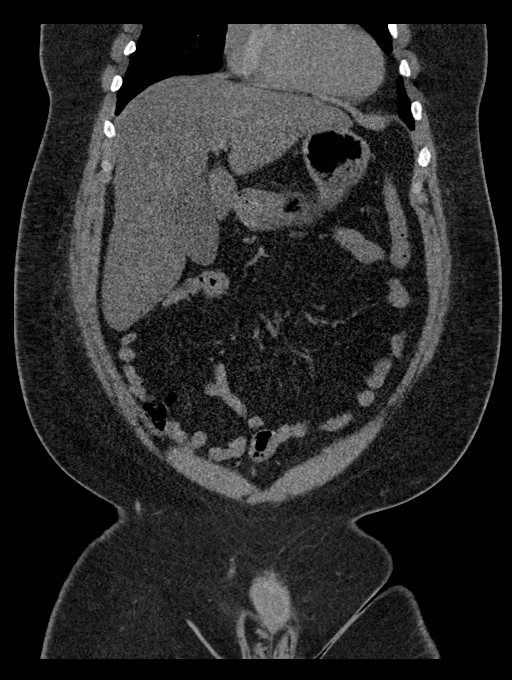
[im 57/127  soft-tissue]
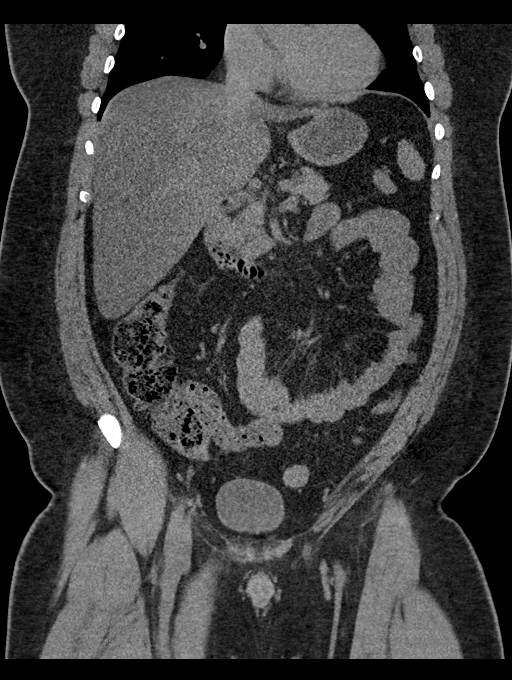
[im 71/127  soft-tissue]
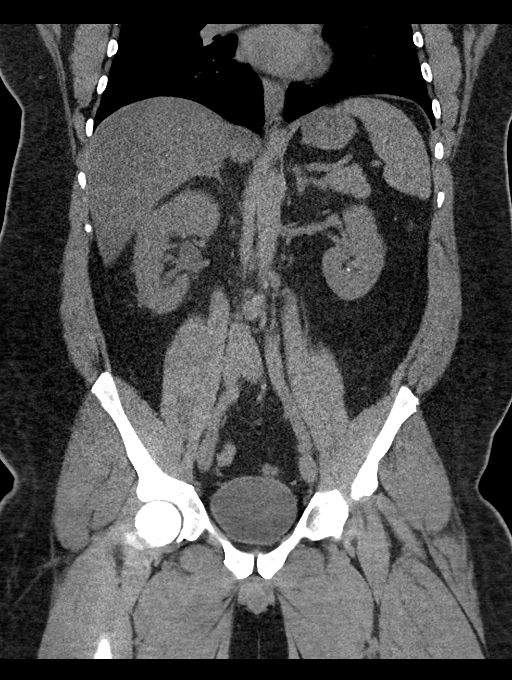

[16 of 46 positions shown; findings below may reference images not displayed]

FINDINGS: Lower chest: Normal

Hepatobiliary: Fatty liver as seen previously. No calcified
gallstones.

Pancreas: Normal

Spleen: Normal

Adrenals/Urinary Tract: Adrenal glands are normal. 2 mm
nonobstructing stone upper pole right kidney. Five by 6 mm stone
previously seen in the upper pole of the right kidney has moved to
the proximal ureter. Mild right hydronephrosis. Multiple tiny
nonobstructing renal calculi on the left. No stone in the bladder.

Stomach/Bowel: Normal

Vascular/Lymphatic: Normal

Reproductive: Normal

Other: No free fluid or air.  Ventral hernia containing only fat.

Musculoskeletal: Normal
IMPRESSION: 5 x 6 mm stone previously seen in the upper pole the right kidney
has passed into the proximal right ureter. Mild right
hydronephrosis.

Other tiny nonobstructing renal calculi bilaterally.

Fatty liver as seen previously.

Ventral/periumbilical hernia containing only fat.

## 2019-02-10 IMAGING — CR DG ABDOMEN 1V
2 series · 2 of 2 positions shown · non-contrast
Comparison: 05/25/2016

CLINICAL DATA: Right ureteral stone

EXAM:
ABDOMEN - 1 VIEW

[t abdomen supine (1 of 2)]
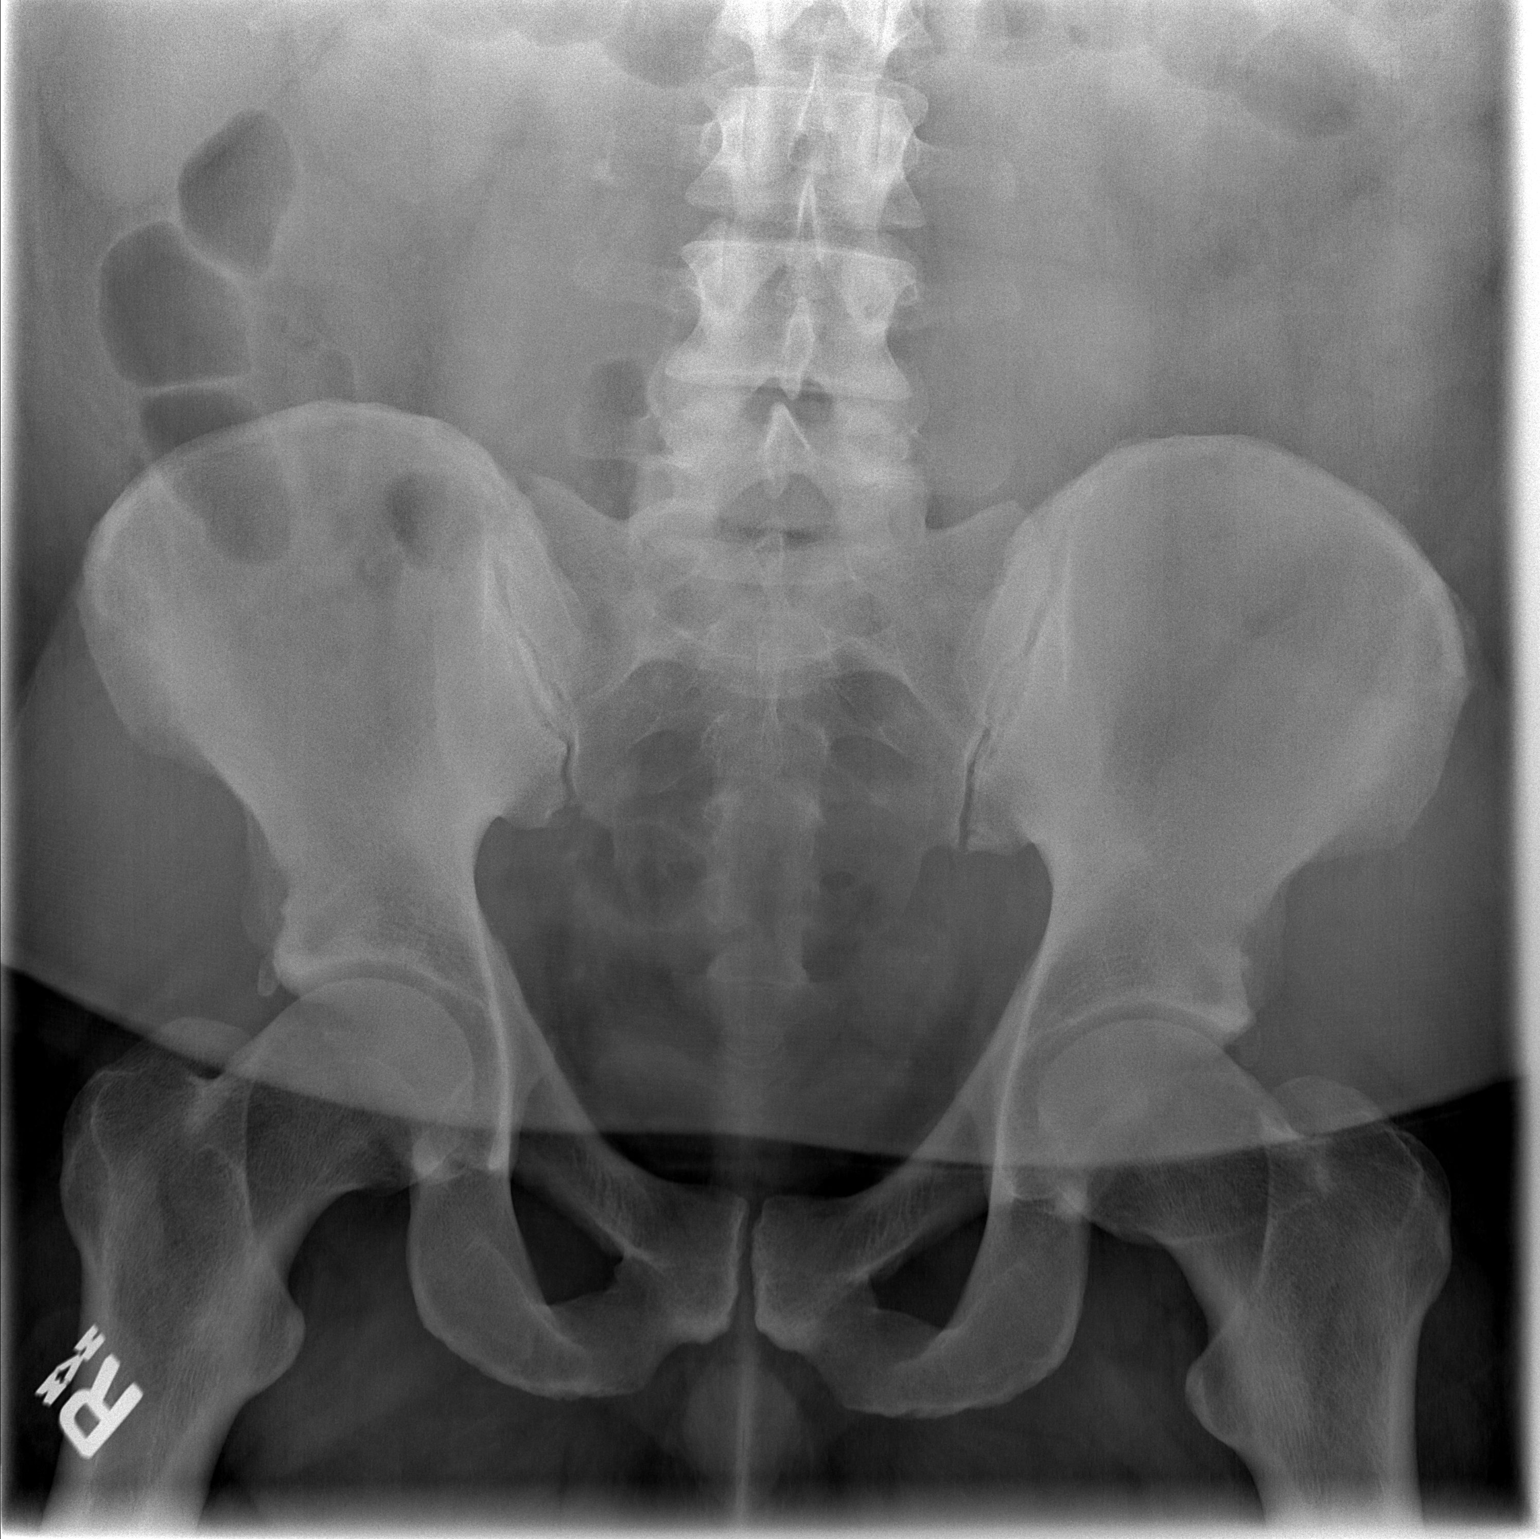

[t abdomen supine (2 of 2)]
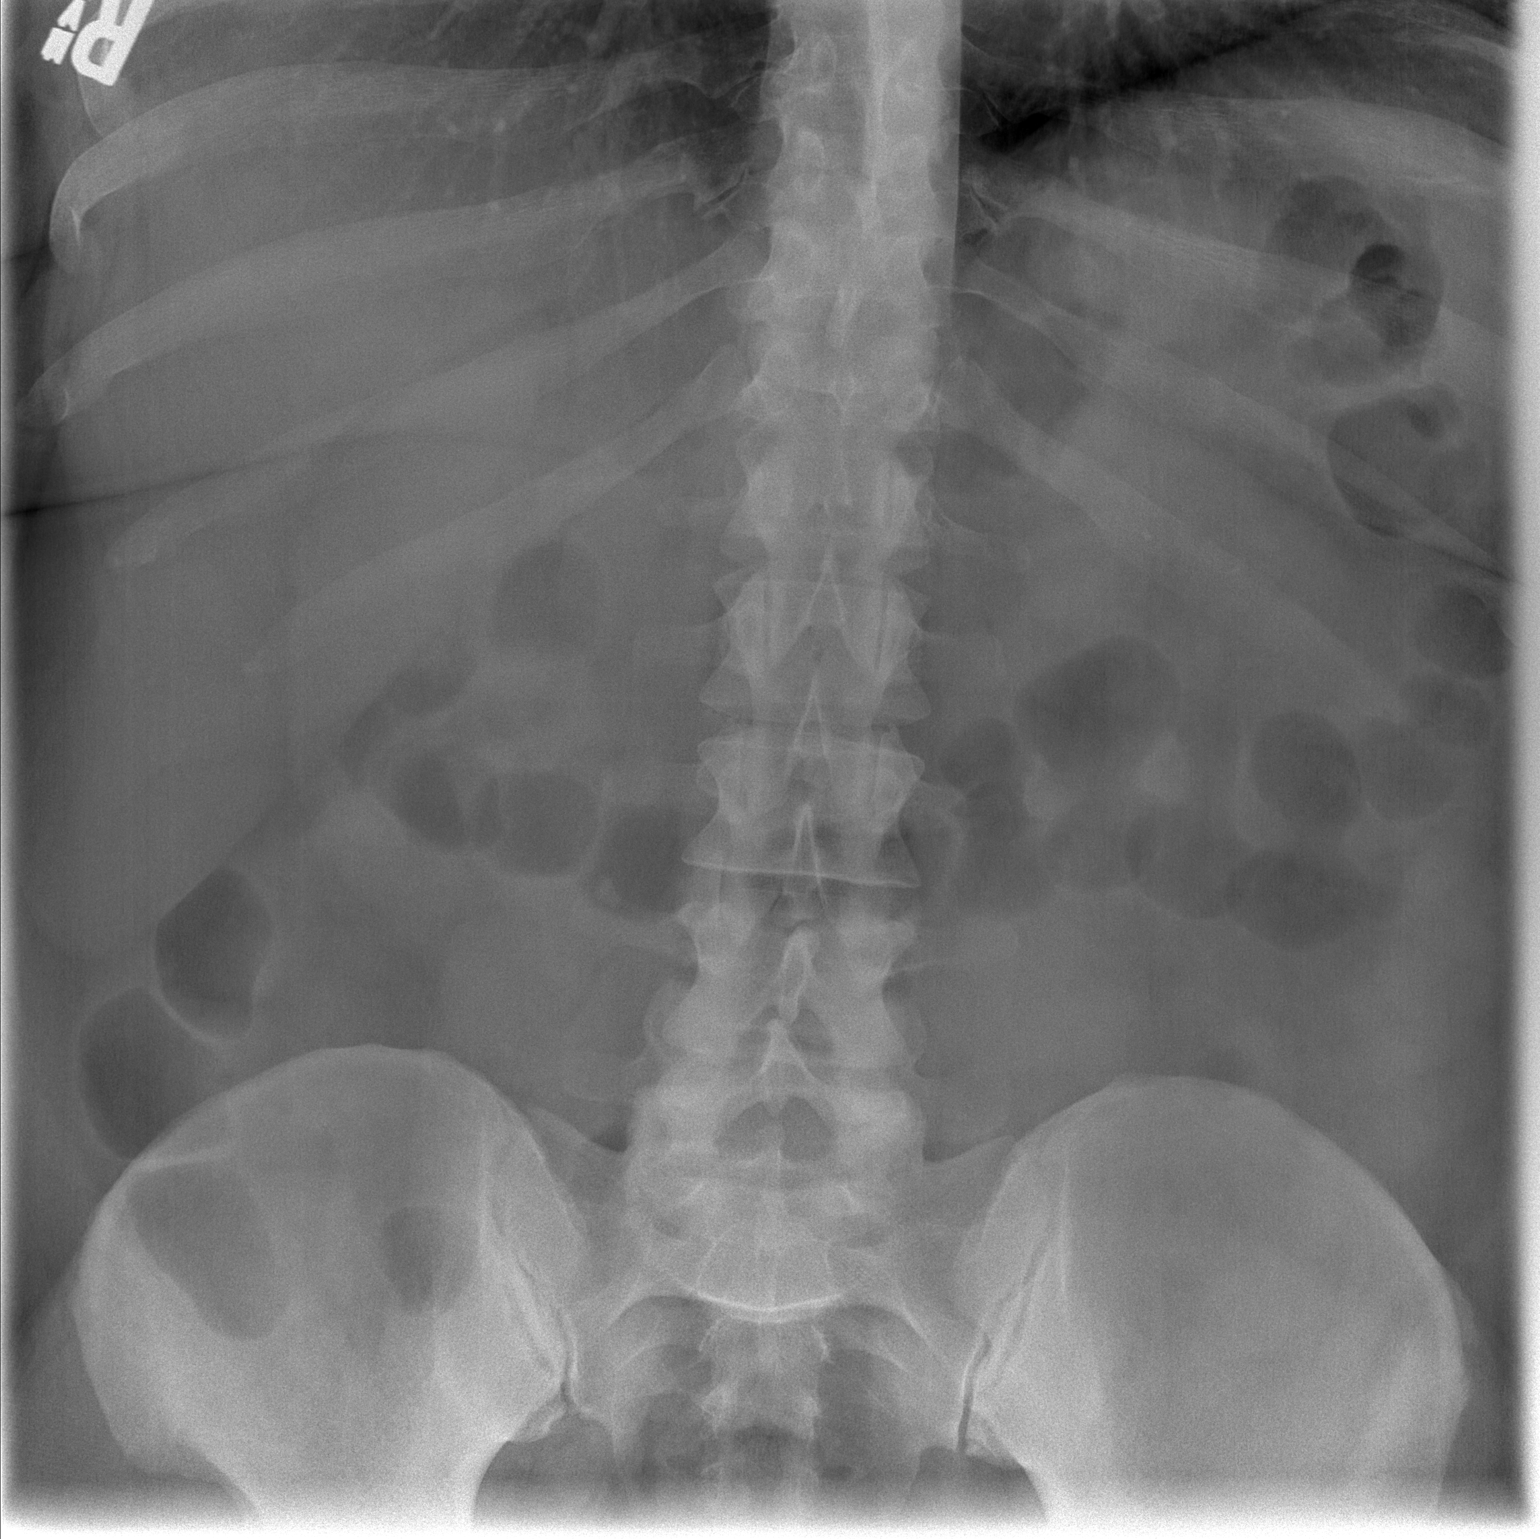

[2 of 2 positions shown; findings below may reference images not displayed]

FINDINGS: Scattered large and small bowel gas is noted. Right ureteral stone
is again identified and stable. No new focal abnormality is seen. No
bony abnormality is noted.
IMPRESSION: Stable right ureteral stone.

## 2021-08-19 ENCOUNTER — Ambulatory Visit: Payer: Managed Care, Other (non HMO) | Admitting: Cardiology

## 2021-08-19 ENCOUNTER — Encounter: Payer: Self-pay | Admitting: Cardiology

## 2021-08-19 VITALS — BP 120/84 | HR 99 | Temp 98.0°F | Resp 16 | Ht 72.0 in | Wt 353.0 lb

## 2021-08-19 DIAGNOSIS — R011 Cardiac murmur, unspecified: Secondary | ICD-10-CM

## 2021-08-19 DIAGNOSIS — R002 Palpitations: Secondary | ICD-10-CM

## 2021-08-19 DIAGNOSIS — I491 Atrial premature depolarization: Secondary | ICD-10-CM

## 2021-08-19 DIAGNOSIS — G4730 Sleep apnea, unspecified: Secondary | ICD-10-CM

## 2021-08-19 NOTE — Progress Notes (Signed)
ID:  Jesse Kail., DOB [birth year], MRN [id number]  PCP:  Lindaann Pascal, PA-C  Cardiologist:  Tessa Lerner, DO, [facility] (established care 08/19/2021) Former Cardiology Providers: Dr. Duke Salvia  REASON FOR CONSULT: Heart murmur evaluation  REQUESTING PHYSICIAN:  Lindaann Pascal, PA-C [address] [place],  [place] [address]  Chief Complaint  Patient presents with   Heart Murmur   New Patient (Initial Visit)    HPI  Jesse Lamarque. is a 41 y.o. African-American male who presents to the clinic for evaluation of cardiac murmur at the request of Long, Scott, PA-C. His past medical history and cardiovascular risk factors include: Sleep Apnea not on CPAP, documented hx of HTN and HLD (not on medical therapy), obesity due to excess calories.  Patient was recently seen at urgent care and was treated for acute bronchitis and was noted to have a cardiac murmur referred to the office for further evaluation and management.   Patient denies angina pectoris, heart failure symptoms, near-syncope or syncopal event.  Occasionally he would have episodes of palpitations which are short-lived, no improving or worsening factors, self-limited.  Does not consume excessive amounts of alcohol.  No consumption of illicit drugs or energy drink. Two 12 oz can and 20-40 oz of tea.   Documented history of hypertension and hyperlipidemia but not on medications.   No family history of premature coronary disease or sudden cardiac death.  FUNCTIONAL STATUS: Walks less than a mile twice a week.  ALLERGIES: Allergies  Allergen Reactions   Pork-Derived Products     Religious preference    MEDICATION LIST PRIOR TO VISIT: Current Meds  Medication Sig   albuterol (VENTOLIN HFA) 108 (90 Base) MCG/ACT inhaler Inhale into the lungs.   indomethacin (INDOCIN) 25 MG capsule Take 25 mg by mouth 2 (two) times daily with a meal.   Multiple Vitamin (MULTIVITAMIN) capsule Take 1 capsule by mouth daily.    Omega-3 Fatty Acids (FISH OIL PO) Take 1,100 mg by mouth.   UNABLE TO FIND Take 500 mg by mouth daily. Med Name: celery seed 500 mg   UNABLE TO FIND Take 1 tablet by mouth daily. Med Name: alfa-crs-plus   UNABLE TO FIND Take 1 tablet by mouth daily. Med Name: deep blue 1 tab     PAST MEDICAL HISTORY: Past Medical History:  Diagnosis Date   Asthma    as child, no problems now   Borderline hypertension    Essential hypertension 11/06/2015   Gout    History of kidney stones    Hyperlipidemia    Hypertension    Morbid obesity (HCC) 11/06/2015    PAST SURGICAL HISTORY: Past Surgical History:  Procedure Laterality Date   ANKLE ARTHROSCOPY Right 11/29/2012   Procedure: RIGHT ANKLE ARTHROSCOPY WITH EXTENSIVE DEBRIDEMENT;  Surgeon: Loreta Ave, MD;  Location: [facility];  Service: Orthopedics;  Laterality: Right;   EXTRACORPOREAL SHOCK WAVE LITHOTRIPSY Right 05/30/2016   Procedure: RIGHT EXTRACORPOREAL SHOCK WAVE LITHOTRIPSY (ESWL);  Surgeon: Malen Gauze, MD;  Location: [facility];  Service: Urology;  Laterality: Right;   HERNIA REPAIR     as 41 years old   WISDOM TOOTH EXTRACTION     WISDOM TOOTH EXTRACTION      FAMILY HISTORY: The patient family history includes Arrhythmia in his father; Arthritis in his paternal grandfather; Diabetes in his mother and paternal grandmother; Heart attack in his maternal grandfather; Heart disease in his father, maternal grandfather, mother, and paternal grandmother; Heart failure  in his paternal grandmother; Hypertension in his maternal grandfather and mother; Multiple sclerosis in his maternal grandmother; Stroke in his maternal grandfather.  SOCIAL HISTORY:  The patient  reports that he has never smoked. He has never used smokeless tobacco. He reports current alcohol use. He reports that he does not use drugs.  REVIEW OF SYSTEMS: Review of Systems  Cardiovascular:  Negative for chest pain, claudication, dyspnea on exertion,  irregular heartbeat, leg swelling, near-syncope, orthopnea, palpitations, paroxysmal nocturnal dyspnea and syncope.  Respiratory:  Negative for shortness of breath.   Hematologic/Lymphatic: Negative for bleeding problem.  Musculoskeletal:  Negative for muscle cramps and myalgias.  Neurological:  Negative for dizziness and light-headedness.    PHYSICAL EXAM:    08/19/2021    1:12 PM 05/30/2016    6:05 PM 05/30/2016    5:40 PM  Vitals with BMI  Height 6\' 0"     Weight 353 lbs    BMI 47.87    Systolic 120 161  Diastolic 84 102 101  Pulse 99 270 104    CONSTITUTIONAL: Appears older than stated age, hemodynamically stable, No acute distress.  SKIN: Skin is warm and dry. No rash noted. No cyanosis. No pallor. No jaundice HEAD: Normocephalic and atraumatic.  EYES: No scleral icterus MOUTH/THROAT: Moist oral membranes.  NECK: No JVD present. No thyromegaly noted. No carotid bruits  CHEST Normal respiratory effort. No intercostal retractions  LUNGS: Clear to auscultation bilaterally.  No stridor. No wheezes. No rales.  CARDIOVASCULAR: Regular rate and rhythm, positive S1-S2, extrasystolic beats, no murmurs, rubs or gallops appreciated. ABDOMINAL: Obese, nontender, nondistended, positive bowel sounds in all 4 quadrants, no apparent ascites.  EXTREMITIES: No peripheral edema, warm to touch, 2+ bilateral DP and PT pulses HEMATOLOGIC: No significant bruising NEUROLOGIC: Oriented to person, place, and time. Nonfocal. Normal muscle tone.  PSYCHIATRIC: Normal mood and affect. Normal behavior. Cooperative  CARDIAC DATABASE: EKG: 08/19/2021: NSR, 93 bpm, normal axis, frequent PACs.  Echocardiogram: No results found for this or any previous visit from the past 1095 days.    Stress Testing: No results found for this or any previous visit from the past 1095 days.   Heart Catheterization: None  LABORATORY DATA:    Latest Ref Rng & Units 05/23/2016    1:57 PM 08/30/2015    9:11 PM  02/15/2015    3:59 PM  CBC  WBC 4.0 - 10.5 K/uL 12.7  9.8  7.7   Hemoglobin 13.0 - 17.0 g/dL 02/17/2015  09.3  81.8   Hematocrit 39.0 - 52.0 % 42.0  42.3  42.7   Platelets 150 - 400 K/uL 239  227  234        Latest Ref Rng & Units 05/23/2016    1:57 PM 08/30/2015    9:11 PM 02/15/2015    3:59 PM  CMP  Glucose 65 - 99 mg/dL 02/17/2015  371  696   BUN 6 - 20 mg/dL 16  9  10    Creatinine 0.61 - 1.24 mg/dL 789   3.81   Sodium 135 - 145 mmol/L 139  141  144   Potassium 3.5 - 5.1 mmol/L 4.1  3.7  4.4   Chloride 101 - 111 mmol/L 104  106  103   CO2 22 - 32 mmol/L 22  26  28    Calcium 8.9 - 10.3 mg/dL 9.2  9.2  9.7   Total Protein 6.5 - 8.1 g/dL 7.8   7.9   Total Bilirubin 0.3 - 1.2 mg/dL  0.6   0.6   Alkaline Phos 38 - 126 U/L 68   68   AST 15 - 41 U/L 25   34   ALT 17 - 63 U/L 35   59     Lipid Panel  No results found for: "CHOL", "TRIG", "HDL", "CHOLHDL", "VLDL", "LDLCALC", "LDLDIRECT", "LABVLDL"  No components found for: "NTPROBNP" No results for input(s): "PROBNP" in the last 8760 hours. No results for input(s): "TSH" in the last 8760 hours.  BMP No results for input(s): "NA", "K", "CL", "CO2", "GLUCOSE", "BUN", "CREATININE", "CALCIUM", "GFRNONAA", "GFRAA" in the last 8760 hours.  HEMOGLOBIN A1C No results found for: "HGBA1C", "MPG"  IMPRESSION:    ICD-10-CM   1. Cardiac murmur  R01.1 EKG 12-Lead    PCV ECHOCARDIOGRAM COMPLETE    2. PAC (premature atrial contraction)  I49.1 LONG TERM MONITOR (3-14 DAYS)    3. Palpitations  R00.2 LONG TERM MONITOR (3-14 DAYS)    4. Sleep apnea, unspecified type  G47.30     5. Class 3 severe obesity due to excess calories without serious comorbidity with body mass index (BMI) of 45.0 to 49.9 in adult [facility])  E66.01    Z68.42        RECOMMENDATIONS: Jesse Lennox. is a 41 y.o. African-American male whose past medical history and cardiac risk factors include: Sleep Apnea not on CPAP, documented hx of HTN and HLD (not on medical  therapy), obesity due to excess calories.  Referred to the office for evaluation of a cardiac murmur at the request of his PCP.  On auscultation I do not appreciate any significant murmurs but do notice extra systolic beats which is likely secondary to PACs which are noted on surface ECG.  We will still proceed with an echocardiogram to evaluate for LVEF,  diastolic function and valvular heart disease.  With regards to PACs patient appears to be asymptomatic majority of the times.  But at times he does have intermittent palpitations which are short-lived.  I have asked him to minimize the consumption of sodas and tea on a regular basis to see if this would help improve his symptoms.  In addition we will proceed with an extended Holter monitor to evaluate for dysrhythmias.  Patient has a history of hypertension and hyperlipidemia but currently not on medical therapy.  I have no recent labs for review at today's office visit.  I will defer management to PCP at this time.  Patient also has a history of sleep apnea and currently not on CPAP.  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  Patient states that he was evaluated approximately 10 years ago.  I have asked him to discuss this with PCP and consider referral to sleep medicine for reevaluation of sleep apnea and if present to be treated.  Both him and his wife verbalized understanding.   FINAL MEDICATION LIST END OF ENCOUNTER: No orders of the defined types were placed in this encounter.   Medications Discontinued During This Encounter  Medication Reason   HYDROcodone-acetaminophen (NORCO/VICODIN) 5-325 MG tablet    OVER THE COUNTER MEDICATION    OVER THE COUNTER MEDICATION    promethazine (PHENERGAN) 25 MG tablet    tamsulosin (FLOMAX) 0.4 MG CAPS capsule      Current Outpatient Medications:    albuterol (VENTOLIN HFA) 108 (90 Base) MCG/ACT inhaler, Inhale  into the lungs., Disp: , Rfl:  indomethacin (INDOCIN) 25 MG capsule, Take 25 mg by mouth 2 (two) times daily with a meal., Disp: , Rfl:    Multiple Vitamin (MULTIVITAMIN) capsule, Take 1 capsule by mouth daily., Disp: , Rfl:    Omega-3 Fatty Acids (FISH OIL PO), Take 1,100 mg by mouth., Disp: , Rfl:    UNABLE TO FIND, Take 500 mg by mouth daily. Med Name: celery seed 500 mg, Disp: , Rfl:    UNABLE TO FIND, Take 1 tablet by mouth daily. Med Name: alfa-crs-plus, Disp: , Rfl:    UNABLE TO FIND, Take 1 tablet by mouth daily. Med Name: deep blue 1 tab, Disp: , Rfl:   Orders Placed This Encounter  Procedures   LONG TERM MONITOR (3-14 DAYS)   EKG 12-Lead   PCV ECHOCARDIOGRAM COMPLETE    There are no Patient Instructions on file for this visit.   --Continue cardiac medications as reconciled in final medication list. --Return in about 8 weeks (around 10/14/2021) for Review test results echo and monitor. or sooner if needed. --Continue follow-up with your primary care physician regarding the management of your other chronic comorbid conditions.  Patient's questions and concerns were addressed to his satisfaction. He voices understanding of the instructions provided during this encounter.   This note was created using a voice recognition software as a result there may be grammatical errors inadvertently enclosed that do not reflect the nature of this encounter. Every attempt is made to correct such errors.  Tessa Lerner, [place], [facility]  Pager: [phone] Office: [phone]

## 2021-09-16 ENCOUNTER — Ambulatory Visit: Payer: Managed Care, Other (non HMO)

## 2021-09-16 ENCOUNTER — Inpatient Hospital Stay: Payer: Managed Care, Other (non HMO)

## 2021-09-16 DIAGNOSIS — R011 Cardiac murmur, unspecified: Secondary | ICD-10-CM

## 2021-09-16 DIAGNOSIS — R002 Palpitations: Secondary | ICD-10-CM

## 2021-09-16 DIAGNOSIS — I491 Atrial premature depolarization: Secondary | ICD-10-CM

## 2021-09-27 NOTE — Progress Notes (Signed)
LVM for patient to return call to discuss results

## 2021-09-28 NOTE — Progress Notes (Signed)
Called and spoke with patient regarding his echocardiogram results.  ?

## 2021-10-14 ENCOUNTER — Ambulatory Visit: Payer: Managed Care, Other (non HMO) | Admitting: Cardiology

## 2021-11-30 NOTE — Progress Notes (Signed)
Tried calling patient no answer left a vm  Patient needs a f/u appt scheduled

## 2021-12-01 NOTE — Progress Notes (Signed)
2nd attempt : Called patient, Na, LMAM

## 2021-12-03 NOTE — Progress Notes (Signed)
Tried calling patient no answer left a vm

## 2022-01-05 ENCOUNTER — Telehealth: Payer: Self-pay

## 2022-01-05 NOTE — Telephone Encounter (Signed)
Called patient and left a detailed VM asking him to please call our office for a follow up appointment.

## 2022-01-05 NOTE — Telephone Encounter (Signed)
-----   Message from [place], [place] sent at 01/05/2022 11:09 AM EST ----- Please follow the thread.   Dr. Odis Hollingshead

## 2022-01-05 NOTE — Progress Notes (Signed)
Spoke with patient, he refused to schedule an appointment. He said the last two appointments he had were expensive and not covered completely by insurance. He said someone previously called him and told him the results were fine and so he feels like it is unnecessary to come in and get a bill for it.

## 2022-01-18 NOTE — Progress Notes (Signed)
Called patient no answer left a vm

## 2022-01-20 ENCOUNTER — Telehealth: Payer: Self-pay

## 2022-01-20 NOTE — Telephone Encounter (Signed)
-----   Message from [place] sent at 01/18/2022 12:16 PM EST ----- Called patient no answer left a vm

## 2022-01-20 NOTE — Telephone Encounter (Signed)
LMTCB

## 2022-01-26 NOTE — Progress Notes (Signed)
I spoke with the patient again, he said he will go see his PCP along with checking his insurance to see what cardiology offices are in his network. I let him know the importance of him following up.
